# Patient Record
Sex: Male | Born: 1935
Health system: Southern US, Community
[De-identification: ages and names within clinical notes are randomized; demographics above are authoritative.]

## PROBLEM LIST (undated history)

## (undated) DIAGNOSIS — N189 Chronic kidney disease, unspecified: Secondary | ICD-10-CM

## (undated) DIAGNOSIS — K219 Gastro-esophageal reflux disease without esophagitis: Secondary | ICD-10-CM

## (undated) DIAGNOSIS — R0600 Dyspnea, unspecified: Secondary | ICD-10-CM

## (undated) DIAGNOSIS — D649 Anemia, unspecified: Secondary | ICD-10-CM

## (undated) DIAGNOSIS — E782 Mixed hyperlipidemia: Secondary | ICD-10-CM

## (undated) DIAGNOSIS — I739 Peripheral vascular disease, unspecified: Secondary | ICD-10-CM

## (undated) DIAGNOSIS — J449 Chronic obstructive pulmonary disease, unspecified: Secondary | ICD-10-CM

## (undated) DIAGNOSIS — I10 Essential (primary) hypertension: Secondary | ICD-10-CM

## (undated) DIAGNOSIS — G709 Myoneural disorder, unspecified: Secondary | ICD-10-CM

## (undated) HISTORY — DX: Anemia, unspecified: D64.9

## (undated) HISTORY — DX: Mixed hyperlipidemia: E78.2

## (undated) HISTORY — DX: Chronic obstructive pulmonary disease, unspecified: J44.9

## (undated) HISTORY — PX: EYE SURGERY: SHX253

## (undated) HISTORY — DX: Peripheral vascular disease, unspecified: I73.9

## (undated) HISTORY — DX: Gastro-esophageal reflux disease without esophagitis: K21.9

## (undated) HISTORY — PX: TESTICLE REMOVAL: SHX68

## (undated) HISTORY — DX: Essential (primary) hypertension: I10

---

## 1993-11-23 HISTORY — PX: PROSTATECTOMY: SHX69

## 2005-11-23 HISTORY — PX: BLADDER TUMOR EXCISION: SHX238

## 2008-03-30 ENCOUNTER — Ambulatory Visit: Payer: Self-pay | Admitting: Urology

## 2008-04-02 ENCOUNTER — Ambulatory Visit: Payer: Self-pay | Admitting: Urology

## 2008-10-01 ENCOUNTER — Ambulatory Visit: Payer: Self-pay | Admitting: Cardiovascular Disease

## 2009-04-18 DIAGNOSIS — J4489 Other specified chronic obstructive pulmonary disease: Secondary | ICD-10-CM | POA: Insufficient documentation

## 2009-04-18 DIAGNOSIS — D649 Anemia, unspecified: Secondary | ICD-10-CM | POA: Insufficient documentation

## 2009-04-18 DIAGNOSIS — E119 Type 2 diabetes mellitus without complications: Secondary | ICD-10-CM | POA: Insufficient documentation

## 2009-04-18 DIAGNOSIS — I129 Hypertensive chronic kidney disease with stage 1 through stage 4 chronic kidney disease, or unspecified chronic kidney disease: Secondary | ICD-10-CM

## 2009-04-18 DIAGNOSIS — E785 Hyperlipidemia, unspecified: Secondary | ICD-10-CM

## 2009-04-18 DIAGNOSIS — K219 Gastro-esophageal reflux disease without esophagitis: Secondary | ICD-10-CM

## 2009-04-18 DIAGNOSIS — I739 Peripheral vascular disease, unspecified: Secondary | ICD-10-CM | POA: Insufficient documentation

## 2009-04-18 DIAGNOSIS — J449 Chronic obstructive pulmonary disease, unspecified: Secondary | ICD-10-CM

## 2009-04-18 DIAGNOSIS — N183 Chronic kidney disease, stage 3 (moderate): Secondary | ICD-10-CM

## 2009-04-29 ENCOUNTER — Ambulatory Visit: Payer: Self-pay | Admitting: Cardiovascular Disease

## 2009-04-29 ENCOUNTER — Encounter: Payer: Self-pay | Admitting: Cardiovascular Disease

## 2009-04-30 ENCOUNTER — Ambulatory Visit: Payer: Self-pay | Admitting: Urology

## 2009-05-06 ENCOUNTER — Telehealth: Payer: Self-pay | Admitting: Cardiovascular Disease

## 2009-05-07 ENCOUNTER — Ambulatory Visit: Payer: Self-pay | Admitting: Urology

## 2009-06-04 ENCOUNTER — Ambulatory Visit: Payer: Self-pay

## 2009-06-04 ENCOUNTER — Encounter: Payer: Self-pay | Admitting: Cardiovascular Disease

## 2009-11-05 ENCOUNTER — Ambulatory Visit: Payer: Self-pay | Admitting: Cardiovascular Disease

## 2009-11-05 DIAGNOSIS — F17200 Nicotine dependence, unspecified, uncomplicated: Secondary | ICD-10-CM

## 2010-01-15 ENCOUNTER — Encounter: Payer: Self-pay | Admitting: Cardiovascular Disease

## 2010-01-17 ENCOUNTER — Ambulatory Visit: Payer: Self-pay | Admitting: Cardiovascular Disease

## 2010-02-04 ENCOUNTER — Ambulatory Visit: Payer: Self-pay | Admitting: Urology

## 2010-03-05 ENCOUNTER — Emergency Department: Payer: Self-pay | Admitting: Emergency Medicine

## 2010-03-13 ENCOUNTER — Emergency Department: Payer: Self-pay | Admitting: Emergency Medicine

## 2010-06-17 ENCOUNTER — Ambulatory Visit: Payer: Self-pay | Admitting: Urology

## 2010-07-01 ENCOUNTER — Ambulatory Visit: Payer: Self-pay | Admitting: Urology

## 2010-07-02 LAB — PATHOLOGY REPORT

## 2010-09-04 ENCOUNTER — Emergency Department: Payer: Self-pay | Admitting: Emergency Medicine

## 2010-12-23 NOTE — Assessment & Plan Note (Signed)
Summary: SURGICAL CLEARANCE FOR SURGERY   Visit Type:  New patient Referring Provider:  Priscella Donna/McAlhany Primary Provider:  Reginia Forts, MD  CC:  bladder biopsy.  History of Present Illness: Mr. Reineck is a pleasant 76 year old Caucasian male with past medical history significant for hypertension, tobacco abuse, diabetes mellitus, COPD, and hyperlipidemia who was initially seen in our [facility] in November 2009. The patient moved to [place] two  years ago from [place].   Ultrasounds of his legs done in [place] in 2006 showed significant peripheral vascular disease. Interestingly, repeat ultrasounds done at 12 hour on July 2010 showed triphasic waveforms in normal ABIs consistent with no significant lower extremity arterial disease. He also had a carotid ultrasound that showed minimal disease on the left internal carotid artery.  Mr. Leve has been feeling well, walking at the mall. He does have shortness of breath which he attributes to his COPD. He takes nebulizer treatments at home though does not have any inhalers to take. He denies any significant chest pain. Overall he does sit and watch TV a significant amount though it is active with his wife and goes shopping several times per week.  he states that his bladder cancer and his prostate cancer might be back and he is to see Dr. Jacqlyn Larsen next month for a surgical procedure and biopsies. He continues to smoke one half pack per day. He states that his diabetes is well controlled.  Current Problems (verified): 1)  Tobacco Abuse  (ICD-305.1) 2)  Pvd  (ICD-443.9) 3)  Hypertension, Unspecified  (ICD-401.9) 4)  Hyperlipidemia-mixed  (ICD-272.4) 5)  COPD  (ICD-496) 6)  Anemia  (ICD-285.9) 7)  Gerd  (ICD-530.81) 8)  Diabetes Mellitus  (ICD-250.00)  Current Medications (verified): 1)  Aspirin 81 Mg Tbec (Aspirin) .... Take One Tablet By Mouth Daily 2)  Omeprazole 20 Mg Cpdr (Omeprazole) .... Take 1 By Mouth Once Daily 3)   Hydrochlorothiazide 25 Mg Tabs (Hydrochlorothiazide) .... Take 1 By Mouth Once Daily 4)  Simvastatin 40 Mg Tabs (Simvastatin) .... Take One Tablet By Mouth Daily At Bedtime 5)  Metformin Hcl 500 Mg Tabs (Metformin Hcl) .... Take 1 By Mouth in The Am, Take 2 By Mouth in The Pm 6)  Novolin 70/30 70-30 % Susp (Insulin Isophane & Regular) .... As Directed 7)  Iron 65 Mg .... Twice A Day 8)  Vit B6  100 Mg .... Take Once Daily 9)  Fish Oil  Oil (Fish Oil) .... Once Daily 10)  Niacin 500 Mg Tabs (Niacin) .... Once Daily 11)  Lisinopril 10 Mg Tabs (Lisinopril) .... Take One Tablet By Mouth Daily 12)  Loratadine Allergy Relief 10 Mg Tbdp (Loratadine) .... Take Once Daily 13)  Cilostazol 100 Mg Tabs (Cilostazol) .... 2 Tablets Daily 14)  Docusate Sodium 100 Mg Caps (Docusate Sodium) .... 3 Times Daily As Needed 15)  Ipratropium-Albuterol 0.5-2.5 (3) Mg/76ml Soln (Ipratropium-Albuterol) .... Three Times A Day  Allergies (verified): 1)  ! Betadine  Past History:  Past Medical History: Last updated: 04/18/2009 PVD (ICD-443.9) HYPERTENSION, UNSPECIFIED (ICD-401.9) HYPERLIPIDEMIA-MIXED (ICD-272.4) COPD (ICD-496) ANEMIA (ICD-285.9) GERD (ICD-530.81) DIABETES MELLITUS (ICD-250.00)    Past Surgical History: Last updated: 04/18/2009 Prostatectomy - 1995 tumor removed from bladder - 2007  Family History: Last updated: 04/18/2009 Family History of Diabetes:  CHF  Social History: Last updated: 04/18/2009 Retired  Married  Tobacco Use - Yes.  Alcohol Use - no Regular Exercise - yes Drug Use - no  Risk Factors: Exercise: yes (04/18/2009)  Risk Factors:  Smoking Status: current (04/18/2009)  Review of Systems  The patient denies anorexia, fever, weight loss, weight gain, vision loss, decreased hearing, hoarseness, chest pain, syncope, dyspnea on exertion, peripheral edema, prolonged cough, headaches, hemoptysis, abdominal pain, melena, hematochezia, severe indigestion/heartburn,  hematuria, incontinence, genital sores, muscle weakness, suspicious skin lesions, transient blindness, difficulty walking, depression, unusual weight change, abnormal bleeding, enlarged lymph nodes, and testicular masses.    Vital Signs:  Patient profile:   76 year old male Height:      68 inches Weight:      188 pounds BMI:     28.69 Pulse rate:   101 / minute BP sitting:   136 / 64  (left arm) Cuff size:   regular  Vitals Entered By: Mignon Pine, RMA (January 17, 2010 9:36 AM)  Physical Exam  General:  elderly gentleman in no apparent distress, alert and oriented x3, HEENT exam is benign, neck is supple with no JVP or carotid bruits, heart sounds are regular with normal S1 and S2 and no murmurs appreciated, lungs are clear though moderately decreased breath sounds bilaterally throughout, abdominal exam notable for moderate obesity the otherwise benign, no significant lower extremity edema, neurologic exam is nonfocal skin is warm and dry.    EKG  Procedure date:  01/17/2010  Findings:      sinus tachycardia with rate of 101 beats per minute, left axis deviation, no significant ST or T wave changes.  Impression & Recommendations:  Problem # 1:  PRE-OPERATIVE CARDIAC EXAM (ICD-V72.81) No known coronary artery disease. There is a significant discrepancy between a lower extremity ultrasound in 2006 done in [place] and a more recent ultrasound done at [place], with  his carotids and lower extremities. The more recent studies suggest no significant peripheral vascular disease. He has no angina or suggestion of underlying active cardiac pathology, no claudication symptoms. His heart rate is elevated which I believe is due to his severe COPD. He states that he would like an inhaler for the daytime when  he is out of the house and I have prescribed a Combivent inhaler. Have asked him to try the Combivent inhaler though monitor his heart rate closely. If he has worsening tachycardia with  albuterol, we may need to change this to Xopenex inhaler. He is uncertain if he would be able to afford other inhalers. He'll monitor his baseline heart rate for me at home through the course of the next month in preparation for surgery.  Overall he would be low risk for his upcoming surgery next month. No further cardiac testing is indicated.  If his heart rate is elevated heading into surgery, he may benefit from a low-dose beta blocker such as by bystolic 5 milligrams the day prior and the day of surgery. We have samples that he could have for these few days.   His updated medication list for this problem includes:    Aspirin 81 Mg Tbec (Aspirin) .Marland Kitchen... Take one tablet by mouth daily    Lisinopril 10 Mg Tabs (Lisinopril) .Marland Kitchen... Take one tablet by mouth daily    Cilostazol 100 Mg Tabs (Cilostazol) .Marland Kitchen... 2 tablets daily  Problem # 2:  HYPERTENSION, UNSPECIFIED (ICD-401.9) Well-controlled on his current medications. No medication changes made at this time. His updated medication list for this problem includes:    Aspirin 81 Mg Tbec (Aspirin) .Marland Kitchen... Take one tablet by mouth daily    Hydrochlorothiazide 25 Mg Tabs (Hydrochlorothiazide) .Marland Kitchen... Take 1 by mouth once daily    Lisinopril 10  Mg Tabs (Lisinopril) .Marland Kitchen... Take one tablet by mouth daily  Problem # 3:  PVD (ICD-443.9) mild carotid arterial disease by the most recent ultrasounds done through our clinic. It is a clear discrepancy with the ultrasounds done in [place] 5 years ago. We'll continue aggressive lipid management and aspirin.  Patient Instructions: 1)  Your physician recommends that you schedule a follow-up appointment in: 1 year 2)  Your physician has recommended you make the following change in your medication: Combivent Inhaler called into [facility]  Appended Document: SURGICAL CLEARANCE FOR SURGERY    Clinical Lists Changes  Medications: Changed medication from COMBIVENT 18-103 MCG/ACT AERO (IPRATROPIUM-ALBUTEROL) 1 puff every  4 hours as needed to PROAIR HFA 108 (90 BASE) MCG/ACT AERS (ALBUTEROL SULFATE) 1 puff every 4 hours as needed

## 2010-12-23 NOTE — Progress Notes (Signed)
Summary: Imprimis  Imprimis   Imported By: Zenovia Jarred 01/20/2010 10:29:58  _____________________________________________________________________  External Attachment:    Type:   Image     Comment:   External Document

## 2011-01-15 ENCOUNTER — Encounter: Payer: Self-pay | Admitting: Cardiovascular Disease

## 2011-02-04 ENCOUNTER — Ambulatory Visit: Payer: Self-pay | Admitting: Family Medicine

## 2011-02-04 LAB — HM DEXA SCAN

## 2011-02-09 ENCOUNTER — Telehealth: Payer: Self-pay | Admitting: Cardiovascular Disease

## 2011-02-10 ENCOUNTER — Telehealth: Payer: Self-pay | Admitting: Cardiovascular Disease

## 2011-02-10 NOTE — Telephone Encounter (Signed)
Phone Note  Call from Patient Call back at Home Phone [phone]   Caller: Wife Arrie Aran) Call For: Gollan Summary of Call: BP Readings: 3/11 - 123/64, 3/13 - 129/56, 3/15 -100/51, 3/17 - 95/42, 3/19 - 80/43 Initial call taken by: Zenovia Jarred,  February 09, 2011 11:42 AM

## 2011-02-10 NOTE — Telephone Encounter (Signed)
Spoke to pt's wife, she states with pt's BP 80/43 now he is asymptomatic. Instructed that pt to incr fluids right now and to rest. Advised he monitor BP, and to call office if BP decreases. Pt was started on Amlodipine at last ov after HCTZ was stopped. Pt is also taking Lisinopril. Advised pt hold both Lisinopril and Amlodipine until BP back to baseline and when this occurs to only start Lisinopril again per Dr. Rockey Situ. Pt will write down BP numbers for ~5 days and send to office or call in so Dr. Rockey Situ can assess if changes need to be made. Pt ok with this. I called back later today and pt's wife states his BP was 104/49.

## 2011-02-16 ENCOUNTER — Telehealth: Payer: Self-pay | Admitting: Cardiovascular Disease

## 2011-02-16 NOTE — Telephone Encounter (Signed)
Notified patient needs to bring in BP & Heart rate readings for the doctor to review.  The patient states readings are looking really good and the patient will bring in on Friday for the doctor to review.

## 2011-02-16 NOTE — Telephone Encounter (Signed)
Pt has BP Readings from this past Thursday through today to give.

## 2011-02-24 ENCOUNTER — Telehealth: Payer: Self-pay | Admitting: *Deleted

## 2011-02-24 NOTE — Progress Notes (Signed)
Summary: At Home BP Readings  Phone Note Call from Patient Call back at Home Phone [phone]   Caller: Wife Arrie Aran) Call For: Collie Kittel Summary of Call: BP Readings: 3/11 - 123/64, 3/13 - 129/56, 3/15 -100/51, 3/17 - 95/42, 3/19 - 80/43 Initial call taken by: Zenovia Jarred,  February 09, 2011 11:42 AM  Follow-up for Phone Call        Copied and followed up in [place] RN  February 16, 2011 5:05 PM

## 2011-02-24 NOTE — Telephone Encounter (Signed)
Called and spoke to pt's wife, notified her that pt's BP results he dropped off look great per Dr. Rockey Situ.

## 2011-03-03 ENCOUNTER — Ambulatory Visit: Payer: Self-pay | Admitting: Family Medicine

## 2011-04-07 NOTE — Assessment & Plan Note (Signed)
[facility] OFFICE NOTE   KIRKLAND, GOTTESMAN                       MRN:          [id number]  DATE:10/01/2008                            DOB:          Dec 30, 1934    PRIMARY CARE PHYSICIAN:  Reginia Forts, MD   HISTORY OF PRESENT ILLNESS:  Mr. Foth is a pleasant 76 year old  Caucasian male with past medical history significant for hypertension,  known peripheral vascular disease, tobacco abuse, diabetes mellitus, and  hyperlipidemia who presents today to establish care in our Peripheral  Vascular Clinic.  The patient has been followed by Dr. Rockey Situ of  [facility]; however, his insurance is no longer paying for  him to be seen in this office and he wishes to establish care with  [facility].  The patient moved to [place]  little over a year ago from [place].  He tells me that when he was in  [place], he was diagnosed with lower extremity peripheral vascular  disease.  Records obtained from the [facility] from Apr 22, 2005, showed that the patient had arterial Doppler studies of the lower  extremities performed that showed moderate-to-severe peripheral vascular  occlusive disease at rest in the right leg.  The right ankle brachial  index was 0.57 and with exercise decreased to 0.38.  There was mild  disease noted in the left leg.  The resting ankle brachial index was  0.83 and with exercise increased is 0.87.  The studies were suggestive  of disease in the right iliofemoral artery, right femoral popliteal  artery, and the right tibioperoneal segments.  The patient was started  on Pletal four years ago and started a strict walking program.  He tells  me that since he has been doing his walking on a daily basis, he has no  pain in his lower extremities.  He tells me that he can walk over a mile  each day without having any pain in the right or left lower  extremity.  He has no other complaints today and denies any chest pain, shortness of  breath, palpitations, dizziness, nausea, vomiting, near-syncope,  syncope, orthopnea, PND, or lower extremity edema.  He and his wife both  continue to smoke one-half pack of cigarettes per day and the patient  states that he has no desire to stop.  He has been followed by Dr. Tamala Julian  for his primary care needs and tells me that he has been taking all of  his medications as prescribed.   PAST MEDICAL HISTORY:  1. Hyperlipidemia.  2. Anemia.  3. Tobacco abuse.  4. Hypertension.  5. Peripheral vascular disease.  6. Allergic rhinitis.  7. COPD.  8. Gastroesophageal reflux disease.  9. Prostate cancer.  10.Bladder cancer.  11.Diabetes mellitus.   PAST SURGICAL HISTORY:  1. Prostatectomy.  2. Bladder surgery.   ALLERGIES:  BETADINE.   CURRENT MEDICATIONS:  1. Omeprazole 40 mg once daily.  2. Vitamin B6 100 mg once daily.  3. Cilostazol 100 mg twice daily.  4. Aspirin 81 mg once daily.  5.  Hydrochlorothiazide 25 mg once daily.  6. Zocor 40 mg once daily.  7. Metformin 500 mg twice daily.  8. Lisinopril 10 mg once daily.  9. Vitamin E 400 units twice daily.  10.Fish oil 1000 mg three times daily.  11.70/30 insulin 7 units in the morning and 6 units in the p.m.  12.Stool softener two times per week.  13.Niacin 1000 mg once daily.  14.Iron 65 mg once daily.  15.Albuterol nebulizers twice daily.  16.Ipratropium bromide 0.5 mg twice daily via nebulizer.   SOCIAL HISTORY:  The patient smoked three packs of cigarettes per day  for 55 years and has been smoking one-half pack of cigarettes per day  for the last 4 years.  He denies use of alcohol or illicit drugs.  He is  married and has four children.   FAMILY HISTORY:  The patient's mother died from a perforated ulcer and  his father died from natural causes at an old age.  His brother died  from complications of coronary artery disease as well  as many other  chronic medical conditions.   REVIEW OF SYSTEMS:  As stated in the history of present illness and is  otherwise negative.   PHYSICAL EXAMINATION:  VITALS:  Blood pressure 128/62, pulse 88 and  regular, respirations 12 and nonlabored.  GENERAL:  He is a thin elderly Caucasian male in no acute distress.  He  is alert and oriented x3.  PSYCHIATRIC:  Mood and affect are appropriate.  MUSCULOSKELETAL:  Muscle strength and tone are normal.  NEUROLOGICAL:  No focal neurological deficits.  SKIN:  Warm and dry.  HEENT:  Normal.  NECK:  No JVD.  No carotid bruits.  No thyromegaly.  No lymphadenopathy.  LUNGS:  Clear to auscultation bilaterally with decreased air movement  bilaterally.  There are no wheezes noted.  CARDIOVASCULAR:  Regular rate  and rhythm without murmurs, gallops, or rubs noted.  ABDOMEN:  Soft, nontender, nondistended.  Bowel sounds are present.  EXTREMITIES:  No evidence of edema.  Pulses are 1+ in the bilateral  dorsalis pedis and bilateral posterior tibial arteries.  Pulses are 2+  in the bilateral radial arteries.  The bilateral feet demonstrate no  ulcerations and no color changes.  The feet are pink and warm  bilaterally.   DIAGNOSTIC STUDIES:  1. Peripheral vascular studies performed in May 2006 in [place] as      described in detail above.  2. A 12-lead EKG from July 2009 shows normal sinus rhythm with left      axis deviation and no other abnormalities.   ASSESSMENT AND PLAN:  This is a pleasant 76 year old Caucasian male with  multiple medical problems including hypertension, hyperlipidemia,  tobacco abuse, diabetes mellitus, COPD, and peripheral vascular disease  who presents today to establish care in our Peripheral Vascular Clinic.  The patient has had documentation of severe right lower extremity  peripheral vascular disease by noninvasive studies performed 4 years  ago.  He was started on Pletal at that time and a strict walking  program.   He describes to me no complaints that are suggestive of  claudication.  He walks one mile daily and has no pain in his lower  extremities.  I think at this point aggressive risk factor modification  is a most important step for this patient.  I have encouraged him to  continue taking his current medications and to continue his walking  program.  I also counseled him to stop smoking if  that is at all  possible.  The patient is not agreeable to stopping smoking at the  current time.  I would like to see him back in this office in 6 months.  I think at that time we should check carotid Doppler studies to rule out  occlusive carotid artery disease.  We may also repeat noninvasive  Doppler studies of the lower extremities down the road if the patient  has any symptoms.     Lauree Chandler, MD  Electronically Signed    CM/MedQ  DD: 10/01/2008  DT: 10/02/2008  Job #: [id number]   cc:   Reginia Forts, M.D.

## 2011-05-08 ENCOUNTER — Encounter: Payer: Self-pay | Admitting: Cardiovascular Disease

## 2011-05-29 ENCOUNTER — Ambulatory Visit (INDEPENDENT_AMBULATORY_CARE_PROVIDER_SITE_OTHER): Payer: 59 | Admitting: Cardiovascular Disease

## 2011-05-29 ENCOUNTER — Encounter: Payer: Self-pay | Admitting: Cardiovascular Disease

## 2011-05-29 DIAGNOSIS — I739 Peripheral vascular disease, unspecified: Secondary | ICD-10-CM

## 2011-05-29 DIAGNOSIS — F17200 Nicotine dependence, unspecified, uncomplicated: Secondary | ICD-10-CM

## 2011-05-29 DIAGNOSIS — E785 Hyperlipidemia, unspecified: Secondary | ICD-10-CM

## 2011-05-29 DIAGNOSIS — I10 Essential (primary) hypertension: Secondary | ICD-10-CM

## 2011-05-29 DIAGNOSIS — E119 Type 2 diabetes mellitus without complications: Secondary | ICD-10-CM

## 2011-05-29 DIAGNOSIS — J449 Chronic obstructive pulmonary disease, unspecified: Secondary | ICD-10-CM

## 2011-05-29 MED ORDER — ALBUTEROL 90 MCG/ACT IN AERS: 2.0000 | INHALATION_SPRAY | Freq: Four times a day (QID) | RESPIRATORY_TRACT | Status: DC | PRN

## 2011-05-29 NOTE — Patient Instructions (Addendum)
You are doing well. No medication changes were made. Please call us if you have new issues that need to be addressed before your next appt.  We will call you for a follow up Appt. In 12 months  

## 2011-05-30 NOTE — Assessment & Plan Note (Signed)
.   No changes to the medications were made.Continue simvastatin.

## 2011-05-30 NOTE — Assessment & Plan Note (Signed)
We have encouraged continued exercise, careful diet management in an effort to lose weight. 

## 2011-05-30 NOTE — Progress Notes (Signed)
Patient ID: Erik Gibbs, male    DOB: 25-Jan-1935, 76 y.o.   MRN: [id number]  HPI Comments: Erik Gibbs is a pleasant 76 year old Caucasian male with past medical history significant for hypertension, tobacco abuse, diabetes mellitus, COPD, and hyperlipidemia who was initially seen in our [facility] clinic in November 2009. The patient moved to [place] from [place] several years ago.  Erik Gibbs reports it has been a challenging year. Erik Gibbs has been treated for bladder cancer with BCG treatments.  Erik Gibbs continues to smoke one half pack per day. His wife also smokes. Erik Gibbs has been trying to work on his diabetes.  In the past, Erik Gibbs has used an albuterol inhaler. Erik Gibbs is almost out of his inhaler.    Erik Gibbs walks at the mall. Erik Gibbs does have shortness of breath which Erik Gibbs attributes to his COPD. Erik Gibbs takes nebulizer treatments at home.Marland Kitchen Erik Gibbs denies any significant chest pain. Overall Erik Gibbs does sit and watch TV a significant amount though it is active with his wife and goes shopping several times per week.   Ultrasounds of his legs  July 2010 showed triphasic waveforms in normal ABIs consistent with no significant lower extremity arterial disease.  carotid ultrasound  showed minimal disease on the left internal carotid artery.  EKG shows normal sinus rhythm with rate 80 beats per minute with no significant ST or T wave changes   Outpatient Encounter Prescriptions as of 05/29/2011  Medication Sig Dispense Refill  . aspirin 81 MG tablet Take 81 mg by mouth daily.        . cilostazol (PLETAL) 100 MG tablet Take 100 mg by mouth 2 (two) times daily.        Marland Kitchen docusate sodium (COLACE) 100 MG capsule Take 100 mg by mouth 3 (three) times daily as needed.        . Ferrous Sulfate (IRON) 325 (65 FE) MG TABS Take by mouth 2 (two) times daily.        . insulin NPH-insulin regular (NOVOLIN 70/30) (70-30) 100 UNIT/ML injection Inject into the skin as directed.        Marland Kitchen ipratropium-albuterol (DUONEB) 0.5-2.5 (3) MG/3ML SOLN Take 3 mLs by  nebulization 3 (three) times daily.        Marland Kitchen lisinopril (PRINIVIL,ZESTRIL) 10 MG tablet Take 10 mg by mouth daily.        Marland Kitchen loratadine (CLARITIN) 10 MG tablet Take 10 mg by mouth daily.        . niacin 500 MG tablet Take 500 mg by mouth daily.        . Omega-3 Fatty Acids (FISH OIL) 1200 MG CAPS Take by mouth daily.        Marland Kitchen omeprazole (PRILOSEC) 20 MG capsule Take 20 mg by mouth daily.        Marland Kitchen pyridOXINE (VITAMIN B-6) 100 MG tablet Take 100 mg by mouth daily.        . simvastatin (ZOCOR) 40 MG tablet Take 40 mg by mouth at bedtime.        Marland Kitchen albuterol (PROVENTIL,VENTOLIN) 90 MCG/ACT inhaler Inhale 2 puffs into the lungs every 6 (six) hours as needed for wheezing.  17 g  6     Review of Systems  Constitutional: Negative.   HENT: Negative.   Eyes: Negative.   Respiratory: Positive for shortness of breath.   Cardiovascular: Negative.   Gastrointestinal: Negative.   Musculoskeletal: Positive for arthralgias.  Skin: Negative.   Neurological: Negative.   Hematological: Negative.   Psychiatric/Behavioral: Negative.  All other systems reviewed and are negative.    BP 137/71  Pulse 80  Ht 5\' 8"  (1.727 m)  Wt 178 lb (80.74 kg)  BMI 27.06 kg/m2   Physical Exam  Nursing note and vitals reviewed. Constitutional: Erik Gibbs is oriented to person, place, and time. Erik Gibbs appears well-developed and well-nourished.  HENT:  Head: Normocephalic.  Nose: Nose normal.  Mouth/Throat: Oropharynx is clear and moist.  Eyes: Conjunctivae are normal. Pupils are equal, round, and reactive to light.  Neck: Normal range of motion. Neck supple. No JVD present.  Cardiovascular: Normal rate, regular rhythm, S1 normal, S2 normal, normal heart sounds and intact distal pulses.  Exam reveals no gallop and no friction rub.   No murmur heard. Pulmonary/Chest: Effort normal and breath sounds normal. No respiratory distress. Erik Gibbs has no wheezes. Erik Gibbs has no rales. Erik Gibbs exhibits no tenderness.  Abdominal: Soft. Bowel sounds are  normal. Erik Gibbs exhibits no distension. There is no tenderness.  Musculoskeletal: Normal range of motion. Erik Gibbs exhibits no edema and no tenderness.  Lymphadenopathy:    Erik Gibbs has no cervical adenopathy.  Neurological: Erik Gibbs is alert and oriented to person, place, and time. Coordination normal.  Skin: Skin is warm and dry. No rash noted. No erythema.  Psychiatric: Erik Gibbs has a normal mood and affect. His behavior is normal. Judgment and thought content normal.           Assessment and Plan

## 2011-05-30 NOTE — Assessment & Plan Note (Signed)
We have encouraged him and his wife to consider smoking cessation techniques. They seem reluctant to try anything. His wife seems to enjoy it.

## 2011-05-30 NOTE — Assessment & Plan Note (Signed)
He commented that the price of albuterol was too expensive. We will try an alternate generic inhaler. I've asked him to follow up with Dr. Tamala Julian.

## 2011-05-30 NOTE — Assessment & Plan Note (Signed)
Blood pressure is well controlled on today's visit. No changes made to the medications. 

## 2011-08-05 ENCOUNTER — Ambulatory Visit: Payer: Self-pay | Admitting: Family Medicine

## 2011-09-23 ENCOUNTER — Ambulatory Visit: Payer: Self-pay | Admitting: Family Medicine

## 2011-12-17 ENCOUNTER — Ambulatory Visit: Payer: Self-pay | Admitting: Family Medicine

## 2012-12-09 ENCOUNTER — Ambulatory Visit: Payer: Self-pay | Admitting: Family Medicine

## 2012-12-13 DIAGNOSIS — D090 Carcinoma in situ of bladder: Secondary | ICD-10-CM | POA: Insufficient documentation

## 2012-12-13 DIAGNOSIS — C679 Malignant neoplasm of bladder, unspecified: Secondary | ICD-10-CM | POA: Insufficient documentation

## 2014-07-09 ENCOUNTER — Ambulatory Visit: Payer: Self-pay | Admitting: Family Medicine

## 2014-08-08 ENCOUNTER — Encounter: Payer: Self-pay | Admitting: Pulmonary Disease

## 2014-08-08 ENCOUNTER — Ambulatory Visit (INDEPENDENT_AMBULATORY_CARE_PROVIDER_SITE_OTHER): Payer: Commercial Managed Care - HMO | Admitting: Pulmonary Disease

## 2014-08-08 VITALS — BP 134/66 | HR 78 | Ht 68.0 in | Wt 208.0 lb

## 2014-08-08 DIAGNOSIS — Z23 Encounter for immunization: Secondary | ICD-10-CM

## 2014-08-08 DIAGNOSIS — J449 Chronic obstructive pulmonary disease, unspecified: Secondary | ICD-10-CM

## 2014-08-08 DIAGNOSIS — J438 Other emphysema: Secondary | ICD-10-CM

## 2014-08-08 DIAGNOSIS — E119 Type 2 diabetes mellitus without complications: Secondary | ICD-10-CM

## 2014-08-08 LAB — CBC WITH DIFFERENTIAL/PLATELET
Basophils Absolute: 0 10*3/uL (ref 0.0–0.1)
Basophils Relative: 0.6 % (ref 0.0–3.0)
EOS ABS: 0.3 10*3/uL (ref 0.0–0.7)
EOS PCT: 5.5 % — AB (ref 0.0–5.0)
HEMATOCRIT: 31.4 % — AB (ref 39.0–52.0)
Hemoglobin: 10.6 g/dL — ABNORMAL LOW (ref 13.0–17.0)
LYMPHS ABS: 1.5 10*3/uL (ref 0.7–4.0)
Lymphocytes Relative: 25.6 % (ref 12.0–46.0)
MCHC: 33.7 g/dL (ref 30.0–36.0)
MCV: 95.8 fl (ref 78.0–100.0)
MONO ABS: 0.5 10*3/uL (ref 0.1–1.0)
Monocytes Relative: 8.3 % (ref 3.0–12.0)
Neutro Abs: 3.5 10*3/uL (ref 1.4–7.7)
Neutrophils Relative %: 60 % (ref 43.0–77.0)
PLATELETS: 192 10*3/uL (ref 150.0–400.0)
RBC: 3.28 Mil/uL — ABNORMAL LOW (ref 4.22–5.81)
RDW: 14.3 % (ref 11.5–15.5)
WBC: 5.8 10*3/uL (ref 4.0–10.5)

## 2014-08-08 NOTE — Patient Instructions (Signed)
Stop taking symbicort Start taking Anoro one puff a day no matter how you feel Keep using your duoneb as you are doing  Ask Dr. Venia Minks or your insurance company about the cost of prevnar-13 so you can have it  Bring [place] a sample of your mucus tomorrow morning  Exercise regularly as much as possible  We will see you back in 4 weeks or sooner if needed

## 2014-08-08 NOTE — Assessment & Plan Note (Signed)
He has had a lot of difficulty with hyperglycemia lately. Most of this was related to prednisone but the problem does persist. I am going to try to stop the Symbicort as sometimes that can cause elevated blood sugars. I'm going to replace it with a combination of long-acting bronchodilators (Anoro).

## 2014-08-08 NOTE — Progress Notes (Signed)
Subjective:    Patient ID: Erik Gibbs, male    DOB: 07-May-1935, 79 y.o.   MRN: [id number]  HPI  Erik Gibbs has been sent her by hish PCP for recurrent COPD exacerbations.  He has been on repeated courses of prednisone and zpacks over the years and this "reeks havoc" on his blood sugar.  Up until this last year he only had one exacerbation per year. But in the last 6 months he has been in 3 times for prednisont and aizthromycine.  He still has green mucus in the mornings.      He has had sinus drainage for years which has been about the same in the last few months.    He has a constant cough with mucus production for a while.  He has been on duoneb for about ten years and symbicort this year.  Childhood was normal without respiratory problems.  He had pneumonia once as an adult, but not as a child.  He smoked about 3 packs per day at age 38 and quit in 2011 at age 70.  He has since gained about 50 pounds since then.   He has never been hospitalized for COPD.  He had spirometyr about a year ago.    Between episodes of COPD exacerbations he has some dyspne aon exertion.  He can't vacuum without going slow and he has to walk slower than others.  He can walk a mile, but he has to stop and take a break.  He can walk around [employer] without too much difficulty.  Can't walk up stairs due to dyspnea.    Past Medical History  Diagnosis Date  . PVD (peripheral vascular disease)   . Hypertension   . Hyperlipidemia, mixed   . COPD (chronic obstructive pulmonary disease)   . Anemia   . GERD (gastroesophageal reflux disease)   . Diabetes mellitus      Family History  Problem Relation Age of Onset  . Diabetes Other   . Heart failure Other   . COPD Brother   . Emphysema Father   . Asthma Son     childhood  . Cancer Father     prostate     History   Social History  . Marital Status: Married    Spouse Name: N/A    Number of Children: N/A  . Years of Education: N/A   Occupational  History  . Retired    Social History Main Topics  . Smoking status: Former Smoker -- 3.00 packs/day for 62 years    Types: Cigarettes    Quit date: 08/08/2010  . Smokeless tobacco: Never Used  . Alcohol Use: No  . Drug Use: No  . Sexual Activity: Not on file   Other Topics Concern  . Not on file   Social History Narrative   Married   Gets regular exercise     Allergies  Allergen Reactions  . Amlodipine     BP drops severly  . Nitrofuran Derivatives   . Povidone-Iodine      Outpatient Prescriptions Prior to Visit  Medication Sig Dispense Refill  . aspirin 81 MG tablet Take 81 mg by mouth daily.        . cilostazol (PLETAL) 100 MG tablet Take 100 mg by mouth 2 (two) times daily.       Marland Kitchen docusate sodium (COLACE) 100 MG capsule Take 100 mg by mouth 3 (three) times daily as needed.        Marland Kitchen  Ferrous Sulfate (IRON) 325 (65 FE) MG TABS Take by mouth 3 (three) times daily.       . insulin NPH-insulin regular (NOVOLIN 70/30) (70-30) 100 UNIT/ML injection Inject into the skin as directed.        Marland Kitchen ipratropium-albuterol (DUONEB) 0.5-2.5 (3) MG/3ML SOLN Take 3 mLs by nebulization 3 (three) times daily.        Marland Kitchen lisinopril (PRINIVIL,ZESTRIL) 10 MG tablet Take 10 mg by mouth daily.        Marland Kitchen loratadine (CLARITIN) 10 MG tablet Take 10 mg by mouth daily.        . niacin 500 MG tablet Take 1,000 mg by mouth daily.       . Omega-3 Fatty Acids (FISH OIL) 1200 MG CAPS Take by mouth daily.        Marland Kitchen omeprazole (PRILOSEC) 20 MG capsule Take 20 mg by mouth daily.        Marland Kitchen pyridOXINE (VITAMIN B-6) 100 MG tablet Take 100 mg by mouth daily.        . simvastatin (ZOCOR) 40 MG tablet Take 20 mg by mouth at bedtime.       Marland Kitchen albuterol (PROVENTIL,VENTOLIN) 90 MCG/ACT inhaler Inhale 2 puffs into the lungs every 6 (six) hours as needed for wheezing.  17 g  6   No facility-administered medications prior to visit.      Review of Systems  Constitutional: Negative for fever and unexpected weight change.    HENT: Positive for congestion, postnasal drip and sinus pressure. Negative for dental problem, ear pain, nosebleeds, rhinorrhea, sneezing, sore throat and trouble swallowing.   Eyes: Negative for redness and itching.  Respiratory: Positive for cough and shortness of breath. Negative for chest tightness and wheezing.   Cardiovascular: Negative for palpitations and leg swelling.  Gastrointestinal: Negative for nausea and vomiting.  Genitourinary: Negative for dysuria.  Musculoskeletal: Negative for joint swelling.  Skin: Negative for rash.  Neurological: Negative for headaches.  Hematological: Does not bruise/bleed easily.  Psychiatric/Behavioral: Negative for dysphoric mood. The patient is not nervous/anxious.        Objective:   Physical Exam Filed Vitals:   08/08/14 1011  BP: 134/66  Pulse: 78  Height: 5\' 8"  (1.727 m)  Weight: 208 lb (94.348 kg)  SpO2: 95%   room air  Ambulated 500 feet on room air and his oxygen saturation remained above 95%  Gen: well appearing, no acute distress HEENT: NCAT, PERRL, EOMi, OP clear, neck supple without masses PULM: Diminished bilaterally, some wheezing CV: RRR, no mgr, no JVD AB: BS+, soft, nontender, no hsm Ext: warm, no edema, no clubbing, no cyanosis Derm: no rash or skin breakdown Neuro: A&Ox4, CN II-XII intact, strength 5/5 in all 4 extremities   08/08/14 Simple Spirometry > ratio 37%, FEV1 1.20 L (39% predicted). April 2015 chest x-ray at [facility]> emphysema noted, mild scarring in right lung base     Assessment & Plan:   COPD COPD: GOLD GRADE D, Stage IV airflow obstruction Combined recommendations from the [place], [employer]icians, Investment banker, corporate, European Respiratory Society (Qaseem A et al, Ann Intern Med. 2011;155(3):179) recommends tobacco cessation, pulmonary rehab (for symptomatic patients with an FEV1 < 50% predicted), supplemental oxygen (for patients with SaO2 <88% or  paO2 <55), and appropriate bronchodilator therapy.  In regards to long acting bronchodilators, they recommend monotherapy (FEV1 60-80% with symptoms weak evidence, FEV1 with symptoms <60% strong evidence), or combination therapy (FEV1 <60% with symptoms, strong  recommendation, moderate evidence).  One should also provide patients with annual immunizations and consider therapy for prevention of COPD exacerbations (ie. roflumilast or azithromycin) when appopriate.  -O2 therapy: Not indicated -Immunizations: Flu shot today, he will check with his primary care physician about Prevnar -Tobacco use: Quit 4 years ago -Exercise: Encouraged regular exercise, advised to pulmonary rehabilitation but he declined -Bronchodilator therapy: Change to Anoro (see below) -Exacerbation prevention: This is his biggest problem. I worry about the possibility of AB PA versus an atypical mycobacterial infection. I am going to check serum eosinophils, serum IgE, as well as sputum culture for bacterial, fungal, and mycobacterial cultures.   DIABETES MELLITUS  He has had a lot of difficulty with hyperglycemia lately. Most of this was related to prednisone but the problem does persist. I am going to try to stop the Symbicort as sometimes that can cause elevated blood sugars. I'm going to replace it with a combination of long-acting bronchodilators (Anoro).   Updated Medication List Outpatient Encounter Prescriptions as of 08/08/2014  Medication Sig  . aspirin 81 MG tablet Take 81 mg by mouth daily.    . budesonide-formoterol (SYMBICORT) 160-4.5 MCG/ACT inhaler Inhale 1 puff into the lungs 2 (two) times daily.  . cilostazol (PLETAL) 100 MG tablet Take 100 mg by mouth 2 (two) times daily.   Marland Kitchen docusate sodium (COLACE) 100 MG capsule Take 100 mg by mouth 3 (three) times daily as needed.    . Ferrous Sulfate (IRON) 325 (65 FE) MG TABS Take by mouth 3 (three) times daily.   Marland Kitchen glipiZIDE (GLUCOTROL) 10 MG tablet Take 10 mg by  mouth daily.  . insulin NPH-insulin regular (NOVOLIN 70/30) (70-30) 100 UNIT/ML injection Inject into the skin as directed.    Marland Kitchen ipratropium-albuterol (DUONEB) 0.5-2.5 (3) MG/3ML SOLN Take 3 mLs by nebulization 3 (three) times daily.    Marland Kitchen lisinopril (PRINIVIL,ZESTRIL) 10 MG tablet Take 10 mg by mouth daily.    Marland Kitchen loratadine (CLARITIN) 10 MG tablet Take 10 mg by mouth daily.    . montelukast (SINGULAIR) 10 MG tablet Take 10 mg by mouth at bedtime.  . niacin 500 MG tablet Take 1,000 mg by mouth daily.   . Omega-3 Fatty Acids (FISH OIL) 1200 MG CAPS Take by mouth daily.    Marland Kitchen omeprazole (PRILOSEC) 20 MG capsule Take 20 mg by mouth daily.    Marland Kitchen pyridOXINE (VITAMIN B-6) 100 MG tablet Take 100 mg by mouth daily.    . simvastatin (ZOCOR) 40 MG tablet Take 20 mg by mouth at bedtime.   Marland Kitchen albuterol (PROVENTIL,VENTOLIN) 90 MCG/ACT inhaler Inhale 2 puffs into the lungs every 6 (six) hours as needed for wheezing.

## 2014-08-08 NOTE — Assessment & Plan Note (Addendum)
COPD: GOLD GRADE D, Stage IV airflow obstruction Combined recommendations from the [place], [employer], Investment banker, corporate, European Respiratory Society (Qaseem A et al, Ann Intern Med. 2011;155(3):179) recommends tobacco cessation, pulmonary rehab (for symptomatic patients with an FEV1 < 50% predicted), supplemental oxygen (for patients with SaO2 <88% or paO2 <55), and appropriate bronchodilator therapy.  In regards to long acting bronchodilators, they recommend monotherapy (FEV1 60-80% with symptoms weak evidence, FEV1 with symptoms <60% strong evidence), or combination therapy (FEV1 <60% with symptoms, strong recommendation, moderate evidence).  One should also provide patients with annual immunizations and consider therapy for prevention of COPD exacerbations (ie. roflumilast or azithromycin) when appopriate.  -O2 therapy: Not indicated -Immunizations: Flu shot today, he will check with his primary care physician about Prevnar -Tobacco use: Quit 4 years ago -Exercise: Encouraged regular exercise, advised to pulmonary rehabilitation but he declined -Bronchodilator therapy: Change to Anoro (see below) -Exacerbation prevention: This is his biggest problem. I worry about the possibility of AB PA versus an atypical mycobacterial infection. I am going to check serum eosinophils, serum IgE, as well as sputum culture for bacterial, fungal, and mycobacterial cultures.

## 2014-08-09 ENCOUNTER — Encounter: Payer: Self-pay | Admitting: Pulmonary Disease

## 2014-08-09 LAB — IGE: IgE (Immunoglobulin E), Serum: 8 kU/L (ref <115)

## 2014-08-09 NOTE — Addendum Note (Signed)
Addended by: Karlene Einstein D on: 08/09/2014 11:13 AM   Modules accepted: Orders

## 2014-08-10 ENCOUNTER — Telehealth: Payer: Self-pay | Admitting: Pulmonary Disease

## 2014-08-10 NOTE — Addendum Note (Signed)
Addended by: Karlene Einstein D on: 08/10/2014 04:47 PM   Modules accepted: Orders

## 2014-08-10 NOTE — Progress Notes (Signed)
Quick Note:  lmtcb X1 to relay results. ______ 

## 2014-08-10 NOTE — Progress Notes (Signed)
Quick Note:  Spoke with pt's wife (dpr on file) to explain results. They will expect a call from BQ to further explain this. ______

## 2014-08-10 NOTE — Telephone Encounter (Signed)
A,  I tried to call him to let him know that his eosinophil count was up and likely represents an allergy of some sort. However I had to leave a message. Please let him know that I'd like to talk to him more about this sometime soon. Hoyle Sauer, consider for [employer] B  ----------------------------------------------  Spoke with pt's wife (dpr on file) to explain results.  States that they are available to speak most afternoons.  They would like to discuss this further with Dr. Lake Bells as he is available.  They realize this may not be today.  Forwarding to Dr. Lake Bells as an Juluis Rainier.

## 2014-08-13 NOTE — Addendum Note (Signed)
Addended by: Karlene Einstein D on: 08/13/2014 11:14 AM   Modules accepted: Orders

## 2014-08-15 LAB — ALPHA-1-ANTITRYPSIN PHENOTYP: A-1 Antitrypsin: 161 mg/dL (ref 90–200)

## 2014-08-16 LAB — LOWER RESPIRATORY CULTURE

## 2014-09-05 ENCOUNTER — Ambulatory Visit (INDEPENDENT_AMBULATORY_CARE_PROVIDER_SITE_OTHER): Payer: Commercial Managed Care - HMO | Admitting: Pulmonary Disease

## 2014-09-05 ENCOUNTER — Encounter: Payer: Self-pay | Admitting: Pulmonary Disease

## 2014-09-05 VITALS — BP 124/66 | HR 89 | Ht 68.0 in | Wt 208.0 lb

## 2014-09-05 DIAGNOSIS — R21 Rash and other nonspecific skin eruption: Secondary | ICD-10-CM

## 2014-09-05 DIAGNOSIS — Z23 Encounter for immunization: Secondary | ICD-10-CM

## 2014-09-05 NOTE — Assessment & Plan Note (Signed)
He has severe COPD but he has recovered from his recent exacerbation of COPD. He is now back to his baseline.  Our workup after the last visit revealed peripheral eosinophilia. I advised him that this is been associated with worsening COPD outcomes. We actually advised that he consider enrollment in a clinical trial in [facility] for a biologic agent intended to treat this condition but he is not interested in pursuing this.  Plan: -Continue Symbicort -Continue loratadine -Considering the peripheral eosinophilia and purpuric rash I am going to check ANCA serology to look for Churg-Strauss disease (less likely). If this blood work is negative then he can resume Singulair. - Will followup final sputum AFB culture -Followup 6 months

## 2014-09-05 NOTE — Progress Notes (Signed)
Subjective:    Patient ID: Erik Gibbs, male    DOB: 1935-05-09, 79 y.o.   MRN: [id number]  Synopsis: GOLD grade D COPD first seen in [place] in 2015. Also noted to have mild peripheral eosinophilia with an absolute eosinophil count of 300.  HPI Chief Complaint  Patient presents with  . Follow-up    Pt c/o stable SOB with exertion, prod cough with green mucus unchanged since last visit.  Stopped anoro and restarted symbicort d/t increased coughing.    09/05/2014 ROV > Erik Gibbs said that his cough was worse after taking the Anoro so he switched back to the Symbicort and his cough is better.  He feels like in general he is back to baseline and is feeling well.  He is occasionally bringing up green mucus.  He says he feels good. His blood sugar has still been high.  Changing to Anoro did not help the blood sugar. He has had a rash on his arms for the last week which was itching. This has not been associated with fever, joint aches or weight loss.  Past Medical History  Diagnosis Date  . PVD (peripheral vascular disease)   . Hypertension   . Hyperlipidemia, mixed   . COPD (chronic obstructive pulmonary disease)   . Anemia   . GERD (gastroesophageal reflux disease)   . Diabetes mellitus       Review of Systems  Constitutional: Negative for fever, chills and fatigue.  HENT: Negative for postnasal drip, rhinorrhea and sinus pressure.   Respiratory: Positive for cough. Negative for shortness of breath and wheezing.   Cardiovascular: Negative for chest pain, palpitations and leg swelling.  Skin: Positive for rash.       Objective:   Physical Exam Filed Vitals:   09/05/14 1059  BP: 124/66  Pulse: 89  Height: 5\' 8"  (1.727 m)  Weight: 208 lb (94.348 kg)  SpO2: 95%   RA  Gen: well appearing, no acute distress HEENT: NCAT, EOMi, OP clear, PULM: CTA B CV: RRR, no mgr, no JVD AB: BS+, soft, nontender Ext: warm, no edema, no clubbing, no cyanosis Derm:pupuric rash over  extensor surface of forearm and upper arm bilatearlly or skin breakdown Neuro: A&Ox4, MAEW       Assessment & Plan:   COPD He has severe COPD but he has recovered from his recent exacerbation of COPD. He is now back to his baseline.  Our workup after the last visit revealed peripheral eosinophilia. I advised him that this is been associated with worsening COPD outcomes. We actually advised that he consider enrollment in a clinical trial in [facility] for a biologic agent intended to treat this condition but he is not interested in pursuing this.  Plan: -Continue Symbicort -Continue loratadine -Considering the peripheral eosinophilia and purpuric rash I am going to check ANCA serology to look for Churg-Strauss disease (less likely). If this blood work is negative then he can resume Singulair. - Will followup final sputum AFB culture -Followup 6 months  Rash and nonspecific skin eruption He has an unusual purpuric rash on his arms which apparently was initially itching. It does not look like a typical macular correction. Considering the eosinophilia, worsening wheezing in the last few months, and this rash I am going to screen for Churg-Strauss disease. If that test is negative then he may want to followup with a dermatologist. Erik Gibbs does not seem to be too bothered by this.    Updated Medication List Outpatient Encounter Prescriptions as  of 09/05/2014  Medication Sig  . albuterol (PROVENTIL,VENTOLIN) 90 MCG/ACT inhaler Inhale 2 puffs into the lungs every 6 (six) hours as needed for wheezing.  Marland Kitchen aspirin 81 MG tablet Take 81 mg by mouth daily.    . budesonide-formoterol (SYMBICORT) 160-4.5 MCG/ACT inhaler Inhale 1 puff into the lungs 2 (two) times daily.  . cilostazol (PLETAL) 100 MG tablet Take 100 mg by mouth 2 (two) times daily.   Marland Kitchen docusate sodium (COLACE) 100 MG capsule Take 100 mg by mouth 3 (three) times daily as needed.    . Ferrous Sulfate (IRON) 325 (65 FE) MG TABS Take  by mouth 3 (three) times daily.   Marland Kitchen glipiZIDE (GLUCOTROL) 10 MG tablet Take 10 mg by mouth daily.  . insulin NPH-insulin regular (NOVOLIN 70/30) (70-30) 100 UNIT/ML injection Inject into the skin as directed.    Marland Kitchen ipratropium-albuterol (DUONEB) 0.5-2.5 (3) MG/3ML SOLN Take 3 mLs by nebulization 3 (three) times daily.    Marland Kitchen lisinopril (PRINIVIL,ZESTRIL) 10 MG tablet Take 10 mg by mouth daily.    Marland Kitchen loratadine (CLARITIN) 10 MG tablet Take 10 mg by mouth daily.    . montelukast (SINGULAIR) 10 MG tablet Take 10 mg by mouth at bedtime.  . niacin 500 MG tablet Take 1,000 mg by mouth daily.   . Omega-3 Fatty Acids (FISH OIL) 1200 MG CAPS Take by mouth daily.    Marland Kitchen omeprazole (PRILOSEC) 20 MG capsule Take 20 mg by mouth daily.    Marland Kitchen pyridOXINE (VITAMIN B-6) 100 MG tablet Take 100 mg by mouth daily.    . simvastatin (ZOCOR) 40 MG tablet Take 20 mg by mouth at bedtime.

## 2014-09-05 NOTE — Assessment & Plan Note (Signed)
He has an unusual purpuric rash on his arms which apparently was initially itching. It does not look like a typical macular correction. Considering the eosinophilia, worsening wheezing in the last few months, and this rash I am going to screen for Churg-Strauss disease. If that test is negative then he may want to followup with a dermatologist. Erik Gibbs does not seem to be too bothered by this.

## 2014-09-05 NOTE — Patient Instructions (Signed)
Take the Symbicort as you are doing Don't take the singulair until you hear from [place] about the blood work from today We will see you back in 6 months or sooner if needed

## 2014-09-06 LAB — FUNGUS CULTURE W SMEAR: Smear Result: NONE SEEN

## 2014-09-06 LAB — ANCA SCREEN W REFLEX TITER
Atypical p-ANCA Screen: NEGATIVE
C-ANCA SCREEN: NEGATIVE
P-ANCA SCREEN: NEGATIVE

## 2014-09-10 NOTE — Progress Notes (Signed)
Quick Note:  Pt aware of results. Nothing further needed. ______ 

## 2014-09-19 LAB — CBC AND DIFFERENTIAL
HCT: 34 % — AB (ref 41–53)
HEMOGLOBIN: 11.4 g/dL — AB (ref 13.5–17.5)
Neutrophils Absolute: 5 /µL
Platelets: 245 10*3/uL (ref 150–399)
WBC: 7.8 10*3/mL

## 2014-09-19 LAB — BASIC METABOLIC PANEL
BUN: 33 mg/dL — AB (ref 4–21)
Creatinine: 1.8 mg/dL — AB (ref 0.6–1.3)
GLUCOSE: 221 mg/dL
Potassium: 5 mmol/L (ref 3.4–5.3)
Sodium: 141 mmol/L (ref 137–147)

## 2014-09-19 LAB — HEPATIC FUNCTION PANEL
ALT: 21 U/L (ref 10–40)
AST: 17 U/L (ref 14–40)
Alkaline Phosphatase: 52 U/L (ref 25–125)
BILIRUBIN, TOTAL: 0.2 mg/dL

## 2014-09-24 LAB — AFB CULTURE WITH SMEAR (NOT AT ARMC): Acid Fast Smear: NONE SEEN

## 2015-03-22 LAB — HEMOGLOBIN A1C: HEMOGLOBIN A1C: 6.3 % — AB (ref 4.0–6.0)

## 2015-04-09 DIAGNOSIS — N183 Chronic kidney disease, stage 3 unspecified: Secondary | ICD-10-CM | POA: Insufficient documentation

## 2015-04-09 DIAGNOSIS — D509 Iron deficiency anemia, unspecified: Secondary | ICD-10-CM | POA: Insufficient documentation

## 2015-04-09 DIAGNOSIS — IMO0002 Reserved for concepts with insufficient information to code with codable children: Secondary | ICD-10-CM | POA: Insufficient documentation

## 2015-04-09 DIAGNOSIS — K259 Gastric ulcer, unspecified as acute or chronic, without hemorrhage or perforation: Secondary | ICD-10-CM | POA: Insufficient documentation

## 2015-06-10 ENCOUNTER — Ambulatory Visit (INDEPENDENT_AMBULATORY_CARE_PROVIDER_SITE_OTHER): Payer: PPO | Admitting: Family Medicine

## 2015-06-10 ENCOUNTER — Encounter: Payer: Self-pay | Admitting: Family Medicine

## 2015-06-10 VITALS — BP 122/70 | HR 68 | Temp 98.3°F | Resp 16 | Ht 68.0 in | Wt 208.0 lb

## 2015-06-10 DIAGNOSIS — E1121 Type 2 diabetes mellitus with diabetic nephropathy: Secondary | ICD-10-CM

## 2015-06-10 DIAGNOSIS — D509 Iron deficiency anemia, unspecified: Secondary | ICD-10-CM | POA: Diagnosis not present

## 2015-06-10 DIAGNOSIS — G629 Polyneuropathy, unspecified: Secondary | ICD-10-CM | POA: Diagnosis not present

## 2015-06-10 DIAGNOSIS — E782 Mixed hyperlipidemia: Secondary | ICD-10-CM | POA: Diagnosis not present

## 2015-06-10 DIAGNOSIS — N189 Chronic kidney disease, unspecified: Secondary | ICD-10-CM

## 2015-06-10 DIAGNOSIS — D692 Other nonthrombocytopenic purpura: Secondary | ICD-10-CM | POA: Diagnosis not present

## 2015-06-10 DIAGNOSIS — E1122 Type 2 diabetes mellitus with diabetic chronic kidney disease: Secondary | ICD-10-CM

## 2015-06-10 MED ORDER — GABAPENTIN 300 MG PO CAPS: 300.0000 mg | ORAL_CAPSULE | Freq: Every day | ORAL | Status: DC

## 2015-06-10 NOTE — Progress Notes (Signed)
Patient ID: Erik Gibbs, male   DOB: 20-Feb-1935, 80 y.o.   MRN: [id number]        Patient: Erik Gibbs, Male    DOB: 1935-01-04, 80 y.o.   MRN: [id number] Visit Date: 06/10/2015  Today's Provider: Margarita Rana, MD   No chief complaint on file.  Subjective:    Annual wellness visit Erik Gibbs is a 80 y.o. male who presents today for his Subsequent Annual Wellness Visit. He feels fairly well.  Pt reports having trouble with diabetic nerve pain.  It started about three weeks ago.   He reports exercising on the weekends. He reports he is sleeping well. Would like start Gabapentin for this. Has not has labs done to check for etiology.  Does have easy bruising on arms, but this has not changed.   -----------------------------------------------------------   Review of Systems  Constitutional: Negative.   HENT: Negative.   Eyes: Negative.   Respiratory: Positive for cough.   Cardiovascular: Negative.   Gastrointestinal: Negative.   Endocrine: Negative.   Genitourinary: Negative.   Allergic/Immunologic: Positive for environmental allergies.  Neurological: Positive for numbness.  Hematological: Bruises/bleeds easily.  Psychiatric/Behavioral: Negative.     History   Social History  . Marital Status: Married    Spouse Name: [name]  . Number of Children: 4  . Years of Education: 6th Grade   Occupational History  . Retired    Social History Main Topics  . Smoking status: Former Smoker -- 3.00 packs/day for 62 years    Types: Cigarettes    Quit date: 08/08/2010  . Smokeless tobacco: Never Used  . Alcohol Use: No     Comment: former heavy alcohol use  . Drug Use: No  . Sexual Activity: No   Other Topics Concern  . Not on file   Social History Narrative   Married   Gets regular exercise    Patient Active Problem List   Diagnosis Date Noted  . Neuropathy 06/10/2015  . Diabetes mellitus with diabetic nephropathy 06/10/2015  . Senile purpura 06/10/2015  .  Anemia, iron deficiency 04/09/2015  . Adult BMI 30+ 04/09/2015  . Chronic kidney disease (CKD), stage III (moderate) 04/09/2015  . Gastric ulcer 04/09/2015  . Rash and nonspecific skin eruption 09/05/2014  . Bladder CA in situ 12/13/2012  . TOBACCO ABUSE 11/05/2009  . Benign hypertension with CKD (chronic kidney disease) stage III 04/18/2009  . PVD 04/18/2009  . COPD 04/18/2009  . GERD 04/18/2009  . Combined fat and carbohydrate induced hyperlipemia 09/28/2008  . Allergic rhinitis 12/26/2007  . CAFL (chronic airflow limitation) 12/26/2007  . H/O primary malignant neoplasm of urinary bladder 12/26/2007  . History of colon polyps 12/26/2007  . H/O malignant neoplasm of prostate 12/26/2007  . Heart & renal disease, hypertensive, with heart failure 12/26/2007  . Angiopathy, peripheral 12/26/2007    Past Surgical History  Procedure Laterality Date  . Prostatectomy  1995  . Bladder tumor excision  2007  . Testicle removal Bilateral     Dr. August Saucer    His family history includes Asthma in his son; COPD in his brother; Cancer in his father; Depression in his mother; Diabetes in his brother and other; Emphysema in his father; Heart attack in his brother; Heart disease in his mother; Heart failure in his other; Ulcers in his mother.    Previous Medications   ALBUTEROL (PROVENTIL,VENTOLIN) 90 MCG/ACT INHALER    Inhale 2 puffs into the lungs every 6 (six) hours as needed for  wheezing.   ASPIRIN 81 MG TABLET    Take 81 mg by mouth daily.     BUDESONIDE-FORMOTEROL (SYMBICORT) 160-4.5 MCG/ACT INHALER    Inhale 1 puff into the lungs 2 (two) times daily.   CILOSTAZOL (PLETAL) 100 MG TABLET    Take 100 mg by mouth 2 (two) times daily.    DOCUSATE SODIUM (COLACE) 50 MG CAPSULE    DOCUSATE SODIUM, 50MG  (Oral Capsule)  one every day for 0 days  Quantity: 0.00;  Refills: 0   Ordered :14-Jan-2011  Teena Dunk ;  Started 26-Jun-2009 Active   FAMOTIDINE (PEPCID) 20 MG TABLET    Take by mouth.    FERROUS SULFATE (IRON) 325 (65 FE) MG TABS    Take by mouth 3 (three) times daily.    GLUCOSE BLOOD TEST STRIP    FREESTYLE LITE TEST (In Vitro Strip)  1 (one) Strip Strip to check sugars bid for 0 days  Quantity: 200;  Refills: 3   Ordered :19-Sep-2014  [name] ;  Started 19-Sep-2014 Active Comments: Patient has insulin-depedent type 2 diabetes.  (E11.9) Thanks- Dr. Jerilynn Mages.   INSULIN NPH-INSULIN REGULAR (NOVOLIN 70/30) (70-30) 100 UNIT/ML INJECTION    Inject into the skin as directed.     IPRATROPIUM-ALBUTEROL (DUONEB) 0.5-2.5 (3) MG/3ML SOLN    Take 3 mLs by nebulization 3 (three) times daily.     IRON POLYSACCHARIDES (NIFEREX) 150 MG CAPSULE    Take by mouth.   LISINOPRIL (PRINIVIL,ZESTRIL) 10 MG TABLET    Take 10 mg by mouth daily.     LORATADINE (CLARITIN) 10 MG TABLET    Take 10 mg by mouth daily.     MONTELUKAST (SINGULAIR) 10 MG TABLET    Take 10 mg by mouth at bedtime.   NIACIN 500 MG CR CAPSULE    NIACIN, 500MG  (Oral Capsule Extended Release)  1 Every Day for 0 days  Quantity: 0.00;  Refills: 0   Ordered :08-Oct-2010  [name] ;  Started 28-Mar-2009 Active   OMEGA-3 FATTY ACIDS (FISH OIL) 1200 MG CAPS    Take by mouth daily.     OMEPRAZOLE (PRILOSEC) 20 MG CAPSULE    Take 20 mg by mouth daily.     PYRIDOXINE (VITAMIN B-6) 100 MG TABLET    Take 100 mg by mouth daily.     SIMVASTATIN (ZOCOR) 20 MG TABLET    Take by mouth.    Patient Care Team: Margarita Rana, MD as PCP - General (Family Medicine)     Objective:   Vitals: BP 122/70 mmHg  Pulse 68  Temp(Src) 98.3 F (36.8 C) (Oral)  Resp 16  Ht 5\' 8"  (1.727 m)  Wt 208 lb (94.348 kg)  BMI 31.63 kg/m2  Physical Exam  Constitutional: He is oriented to person, place, and time. He appears well-developed and well-nourished.  Cardiovascular: Normal rate and regular rhythm.   Pulmonary/Chest: Effort normal and breath sounds normal.  Breath sounds decreased throughout.   Abdominal: Soft.  Neurological: He is alert and oriented  to person, place, and time.  Skin: Skin is warm and dry.  Bruising on arms noted.   Psychiatric: He has a normal mood and affect. His behavior is normal. Judgment and thought content normal.    Activities of Daily Living In your present state of health, do you have any difficulty performing the following activities: 06/10/2015  Hearing? N  Vision? N  Difficulty concentrating or making decisions? N  Walking or climbing stairs? Y  Dressing or bathing?  N  Doing errands, shopping? N    Fall Risk Assessment Fall Risk  06/10/2015  Falls in the past year? No     Depression Screen PHQ 2/9 Scores 06/10/2015  PHQ - 2 Score 0    Cognitive Testing - 6-CIT  Correct? Score   What year is it? yes 0 0 or 4  What month is it? yes 0 0 or 3  Memorize:    [place]ia [place],  44,  [place], yes  0   What time is it? (within 1 hour) yes 0 0 or 3  Count backwards from 20 no 2 0, 2, or 4  Name the months of the year no 4 0, 2, or 4  Repeat name & address above yes 0 0, 2, 4, 6, 8, or 10       TOTAL SCORE  6/28   Interpretation:  Normal  Normal (0-7) Abnormal (8-28)       Assessment & Plan:     Annual Wellness Visit  Reviewed patient's Family Medical History Reviewed and updated list of patient's medical providers Assessment of cognitive impairment was done Assessed patient's functional ability Established a written schedule for health screening [place] Completed and Reviewed  Exercise Activities and Dietary recommendations Goals    None      Immunization History  Administered Date(s) Administered  . Influenza,inj,Quad PF,36+ Mos 08/08/2014  . Pneumococcal Conjugate-13 09/05/2014  . Pneumococcal Polysaccharide-23 11/16/2007  . Pneumococcal-Unspecified 08/09/2011  . Tdap 02/22/2012    Health Maintenance  Topic Date Due  . FOOT EXAM  07/25/1947  . OPHTHALMOLOGY EXAM  07/25/1947  . URINE MICROALBUMIN  07/25/1947  . COLONOSCOPY  07/25/1987  .  ZOSTAVAX  07/24/1997  . INFLUENZA VACCINE  06/24/2015  . HEMOGLOBIN A1C  09/21/2015  . TETANUS/TDAP  02/21/2022  . PNA vac Low Risk Adult  Completed   1. Neuropathy Will check labs. Start medication.  - Vitamin B12 - TSH - gabapentin (NEURONTIN) 300 MG capsule; Take 1 capsule (300 mg total) by mouth at bedtime.  Dispense: 30 capsule; Refill: 5  2. Type 2 diabetes mellitus with diabetic nephropathy Will recheck A1c in next 2 months. Follow up with nephrologist as scheduled.  - Comprehensive metabolic panel  4. Senile purpura Stable.   5. Anemia, iron deficiency Will check labs.  - CBC with Differential/Platelet - Ferritin - Iron and TIBC  6. Combined fat and carbohydrate induced hyperlipemia Time to recheck labs.  - Lipid panel    Discussed health benefits of physical activity, and encouraged him to engage in regular exercise appropriate for his age and condition.   Margarita Rana, MD    ------------------------------------------------------------------------------------------------------------

## 2015-06-11 ENCOUNTER — Other Ambulatory Visit: Payer: Self-pay

## 2015-06-11 DIAGNOSIS — G629 Polyneuropathy, unspecified: Secondary | ICD-10-CM

## 2015-06-11 MED ORDER — GABAPENTIN 300 MG PO CAPS: 300.0000 mg | ORAL_CAPSULE | Freq: Every day | ORAL | Status: DC

## 2015-06-13 ENCOUNTER — Other Ambulatory Visit: Payer: Self-pay | Admitting: Family Medicine

## 2015-06-14 LAB — TSH: TSH: 1.44 u[IU]/mL (ref 0.450–4.500)

## 2015-06-14 LAB — CBC WITH DIFFERENTIAL/PLATELET
BASOS ABS: 0 10*3/uL (ref 0.0–0.2)
BASOS: 1 %
EOS (ABSOLUTE): 0.4 10*3/uL (ref 0.0–0.4)
Eos: 6 %
Hematocrit: 31.6 % — ABNORMAL LOW (ref 37.5–51.0)
Hemoglobin: 10.3 g/dL — ABNORMAL LOW (ref 12.6–17.7)
Immature Grans (Abs): 0 10*3/uL (ref 0.0–0.1)
Immature Granulocytes: 0 %
LYMPHS: 28 %
Lymphocytes Absolute: 1.8 10*3/uL (ref 0.7–3.1)
MCH: 31.7 pg (ref 26.6–33.0)
MCHC: 32.6 g/dL (ref 31.5–35.7)
MCV: 97 fL (ref 79–97)
MONOCYTES: 7 %
Monocytes Absolute: 0.4 10*3/uL (ref 0.1–0.9)
Neutrophils Absolute: 3.9 10*3/uL (ref 1.4–7.0)
Neutrophils: 58 %
Platelets: 232 10*3/uL (ref 150–379)
RBC: 3.25 x10E6/uL — ABNORMAL LOW (ref 4.14–5.80)
RDW: 14.2 % (ref 12.3–15.4)
WBC: 6.6 10*3/uL (ref 3.4–10.8)

## 2015-06-14 LAB — COMPREHENSIVE METABOLIC PANEL
ALK PHOS: 49 IU/L (ref 39–117)
ALT: 18 IU/L (ref 0–44)
AST: 14 IU/L (ref 0–40)
Albumin/Globulin Ratio: 1.5 (ref 1.1–2.5)
Albumin: 4.2 g/dL (ref 3.5–4.8)
BUN/Creatinine Ratio: 14 (ref 10–22)
BUN: 26 mg/dL (ref 8–27)
Bilirubin Total: 0.3 mg/dL (ref 0.0–1.2)
CHLORIDE: 102 mmol/L (ref 97–108)
CO2: 19 mmol/L (ref 18–29)
Calcium: 9.5 mg/dL (ref 8.6–10.2)
Creatinine, Ser: 1.89 mg/dL — ABNORMAL HIGH (ref 0.76–1.27)
GFR calc non Af Amer: 33 mL/min/{1.73_m2} — ABNORMAL LOW (ref >59)
GFR, EST AFRICAN AMERICAN: 39 mL/min/{1.73_m2} — AB (ref >59)
GLOBULIN, TOTAL: 2.8 g/dL (ref 1.5–4.5)
GLUCOSE: 175 mg/dL — AB (ref 65–99)
POTASSIUM: 5.7 mmol/L — AB (ref 3.5–5.2)
SODIUM: 139 mmol/L (ref 134–144)
Total Protein: 7 g/dL (ref 6.0–8.5)

## 2015-06-14 LAB — LIPID PANEL
Chol/HDL Ratio: 2.3 ratio units (ref 0.0–5.0)
Cholesterol, Total: 123 mg/dL (ref 100–199)
HDL: 53 mg/dL (ref >39)
LDL Calculated: 53 mg/dL (ref 0–99)
Triglycerides: 83 mg/dL (ref 0–149)
VLDL Cholesterol Cal: 17 mg/dL (ref 5–40)

## 2015-06-14 LAB — IRON AND TIBC
Iron Saturation: 23 % (ref 15–55)
Iron: 78 ug/dL (ref 38–169)
TIBC: 335 ug/dL (ref 250–450)
UIBC: 257 ug/dL (ref 111–343)

## 2015-06-14 LAB — VITAMIN B12: VITAMIN B 12: 259 pg/mL (ref 211–946)

## 2015-06-14 LAB — FERRITIN: Ferritin: 32 ng/mL (ref 30–400)

## 2015-06-19 ENCOUNTER — Other Ambulatory Visit: Payer: Self-pay | Admitting: Family Medicine

## 2015-06-19 ENCOUNTER — Other Ambulatory Visit: Payer: Self-pay

## 2015-06-19 ENCOUNTER — Telehealth: Payer: Self-pay

## 2015-06-19 DIAGNOSIS — E875 Hyperkalemia: Secondary | ICD-10-CM

## 2015-06-19 NOTE — Telephone Encounter (Signed)
-----   Message from Margarita Rana, MD sent at 06/17/2015  8:54 AM EDT ----- Labs stable except for elevated potassium. Could be lab drawing error. Recheck this week to be sure and B12 ok on low end. Would recommend start B12 supplement 1000 mcg daily. Thanks.

## 2015-06-19 NOTE — Telephone Encounter (Signed)
Not usual side effect but if helping pain symptoms would continue to adjust insulin as needed.

## 2015-06-19 NOTE — Telephone Encounter (Signed)
Mrs. Haarmann advised as directed below.  Lab sheet up at the front desk.   Thanks,   -Mickel Baas

## 2015-06-19 NOTE — Telephone Encounter (Signed)
Mrs. Arcos reported that Deshay's blood sugar has been elevated since starting Gabapentin.  She was wondering if that could be a side effect.  His blood sugars have been running the high 100's to low-mid 200's.  She has increased his insulin to 45 units twice a day and his sugars have improved to 130's.   Thanks,   -Mickel Baas

## 2015-06-20 NOTE — Telephone Encounter (Signed)
Mrs. Paulik advised as directed below.   Thanks,   -Mickel Baas

## 2015-06-27 LAB — COMPREHENSIVE METABOLIC PANEL
ALBUMIN: 4.2 g/dL (ref 3.5–4.8)
ALT: 17 IU/L (ref 0–44)
AST: 13 IU/L (ref 0–40)
Albumin/Globulin Ratio: 1.5 (ref 1.1–2.5)
Alkaline Phosphatase: 51 IU/L (ref 39–117)
BUN/Creatinine Ratio: 13 (ref 10–22)
BUN: 22 mg/dL (ref 8–27)
CALCIUM: 9.4 mg/dL (ref 8.6–10.2)
CO2: 23 mmol/L (ref 18–29)
Chloride: 102 mmol/L (ref 97–108)
Creatinine, Ser: 1.76 mg/dL — ABNORMAL HIGH (ref 0.76–1.27)
GFR calc Af Amer: 42 mL/min/{1.73_m2} — ABNORMAL LOW (ref >59)
GFR, EST NON AFRICAN AMERICAN: 36 mL/min/{1.73_m2} — AB (ref >59)
GLOBULIN, TOTAL: 2.8 g/dL (ref 1.5–4.5)
Glucose: 229 mg/dL — ABNORMAL HIGH (ref 65–99)
POTASSIUM: 5 mmol/L (ref 3.5–5.2)
SODIUM: 142 mmol/L (ref 134–144)
TOTAL PROTEIN: 7 g/dL (ref 6.0–8.5)

## 2015-08-12 ENCOUNTER — Encounter: Payer: Self-pay | Admitting: Family Medicine

## 2015-08-12 ENCOUNTER — Ambulatory Visit (INDEPENDENT_AMBULATORY_CARE_PROVIDER_SITE_OTHER): Payer: PPO | Admitting: Family Medicine

## 2015-08-12 VITALS — BP 130/64 | HR 76 | Temp 98.6°F | Resp 16 | Wt 210.0 lb

## 2015-08-12 DIAGNOSIS — N183 Chronic kidney disease, stage 3 (moderate): Secondary | ICD-10-CM | POA: Diagnosis not present

## 2015-08-12 DIAGNOSIS — I129 Hypertensive chronic kidney disease with stage 1 through stage 4 chronic kidney disease, or unspecified chronic kidney disease: Secondary | ICD-10-CM

## 2015-08-12 DIAGNOSIS — G629 Polyneuropathy, unspecified: Secondary | ICD-10-CM

## 2015-08-12 DIAGNOSIS — Z23 Encounter for immunization: Secondary | ICD-10-CM | POA: Diagnosis not present

## 2015-08-12 DIAGNOSIS — E1121 Type 2 diabetes mellitus with diabetic nephropathy: Secondary | ICD-10-CM | POA: Diagnosis not present

## 2015-08-12 LAB — POCT GLYCOSYLATED HEMOGLOBIN (HGB A1C): Hemoglobin A1C: 6.8

## 2015-08-12 NOTE — Progress Notes (Signed)
Patient ID: [name], male   DOB: 04-Sep-1935, 80 y.o.   MRN: [id number]        Patient: Erik Gibbs Male    DOB: Jun 09, 1935   80 y.o.   MRN: [id number] Visit Date: 08/12/2015  Today's Provider: Margarita Rana, MD   Chief Complaint  Patient presents with  . Diabetes  . Peripheral Neuropathy  . Hyperlipidemia   Subjective:    HPI  Diabetes Mellitus Type II, Follow-up:   Lab Results  Component Value Date   HGBA1C 6.8 08/12/2015   HGBA1C 6.3* 03/22/2015    Last seen for diabetes 2 months ago.  Management since then includes starting Gabapentin. He reports excellent compliance with treatment. He is not having side effects.  Current symptoms include none and have been stable. Home blood sugar records: Mid's 100's - 200's  Episodes of hypoglycemia? no   Current Insulin Regimen: none Most Recent Eye Exam: 1 Year ago Weight trend: stable Prior visit with dietician: no Current diet: in general, a "healthy" diet   Current exercise:   Pertinent Labs:    Component Value Date/Time   CHOL 123 06/13/2015 0806   TRIG 83 06/13/2015 0806   CHOLHDL 2.3 06/13/2015 0806   CREATININE 1.76* 06/26/2015 0939   CREATININE 1.8* 09/19/2014    Wt Readings from Last 3 Encounters:  08/12/15 210 lb (95.255 kg)  06/10/15 208 lb (94.348 kg)  03/22/15 209 lb (94.802 kg)     Hypertension, follow-up:  BP Readings from Last 3 Encounters:  08/12/15 130/64  06/10/15 122/70  03/22/15 126/72    He was last seen for hypertension 2 months ago.  BP at that visit was 122/70. Management changes since that visit include checking labs. He reports excellent compliance with treatment. He is not having side effects.  He is exercising. He is adherent to low salt diet.   Outside blood pressures are 120/80's. He is experiencing  Patient denies none.   Cardiovascular risk factors include diabetes mellitus, dyslipidemia, hypertension and male gender.  Use of agents associated with  hypertension: none.     Also here for COPD recheck. Has decreased Symbicort to once a day. Breathing stable.  Using nebulizer also.  Takes all of his medication without any difficulty. Does seem more stable in office today. Does not need to go to car emergently.   ------------------------------------------------------------------------          Allergies  Allergen Reactions  . Amlodipine     BP drops severly  . Nitrofuran Derivatives   . Povidone-Iodine    Previous Medications   ALBUTEROL (PROVENTIL,VENTOLIN) 90 MCG/ACT INHALER    Inhale 2 puffs into the lungs every 6 (six) hours as needed for wheezing.   ASPIRIN 81 MG TABLET    Take 81 mg by mouth daily.     BUDESONIDE-FORMOTEROL (SYMBICORT) 160-4.5 MCG/ACT INHALER    Inhale 1 puff into the lungs 2 (two) times daily.   CILOSTAZOL (PLETAL) 100 MG TABLET    Take 100 mg by mouth 2 (two) times daily.    DOCUSATE SODIUM (COLACE) 50 MG CAPSULE    DOCUSATE SODIUM, 50MG  (Oral Capsule)  one every day for 0 days  Quantity: 0.00;  Refills: 0   Ordered :14-Jan-2011  Teena Dunk ;  Started 26-Jun-2009 Active   FAMOTIDINE (PEPCID) 20 MG TABLET    Take by mouth.   FERROUS SULFATE (IRON) 325 (65 FE) MG TABS    Take by mouth 3 (three) times daily.    GABAPENTIN (  NEURONTIN) 300 MG CAPSULE    Take 1 capsule (300 mg total) by mouth at bedtime.   GLUCOSE BLOOD TEST STRIP    FREESTYLE LITE TEST (In Vitro Strip)  1 (one) Strip Strip to check sugars bid for 0 days  Quantity: 200;  Refills: 3   Ordered :19-Sep-2014  [name] ;  Started 19-Sep-2014 Active Comments: Patient has insulin-depedent type 2 diabetes.  (E11.9) Thanks- Dr. Jerilynn Mages.   INSULIN NPH-INSULIN REGULAR (NOVOLIN 70/30) (70-30) 100 UNIT/ML INJECTION    Inject into the skin as directed.     IPRATROPIUM-ALBUTEROL (DUONEB) 0.5-2.5 (3) MG/3ML SOLN    Take 3 mLs by nebulization 3 (three) times daily.     IRON POLYSACCHARIDES (NIFEREX) 150 MG CAPSULE    Take by mouth.   LISINOPRIL  (PRINIVIL,ZESTRIL) 10 MG TABLET    Take 10 mg by mouth daily.     LORATADINE (CLARITIN) 10 MG TABLET    Take 10 mg by mouth daily.     MONTELUKAST (SINGULAIR) 10 MG TABLET    Take 10 mg by mouth at bedtime.   NIACIN 500 MG CR CAPSULE    NIACIN, 500MG  (Oral Capsule Extended Release)  1 Every Day for 0 days  Quantity: 0.00;  Refills: 0   Ordered :08-Oct-2010  [name] ;  Started 28-Mar-2009 Active   OMEGA-3 FATTY ACIDS (FISH OIL) 1200 MG CAPS    Take by mouth daily.     OMEPRAZOLE (PRILOSEC) 20 MG CAPSULE    Take 20 mg by mouth daily.     PYRIDOXINE (VITAMIN B-6) 100 MG TABLET    Take 100 mg by mouth daily.     SIMVASTATIN (ZOCOR) 20 MG TABLET    Take by mouth.    Review of Systems  Constitutional: Negative.   Respiratory: Positive for shortness of breath and wheezing. Negative for apnea, cough, choking, chest tightness and stridor.        History of COPD  Cardiovascular: Negative.   Gastrointestinal: Negative.   Endocrine: Negative.   Musculoskeletal: Negative.     Social History  Substance Use Topics  . Smoking status: Former Smoker -- 3.00 packs/day for 62 years    Types: Cigarettes    Quit date: 08/08/2010  . Smokeless tobacco: Never Used  . Alcohol Use: No     Comment: former heavy alcohol use   Objective:   BP 130/64 mmHg  Pulse 76  Temp(Src) 98.6 F (37 C) (Oral)  Resp 16  Wt 210 lb (95.255 kg)   Results for orders placed or performed in visit on 08/12/15  POCT glycosylated hemoglobin (Hb A1C)  Result Value Ref Range   Hemoglobin A1C 6.8      Physical Exam  Constitutional: He appears well-developed and well-nourished.  Neck: Carotid bruit is not present.  Cardiovascular: Normal rate, regular rhythm, normal heart sounds and normal pulses.   Pulmonary/Chest: Effort normal. He has wheezes (Prolonged expiratory phase; slight scattered wheezes. ).  Psychiatric: He has a normal mood and affect. His speech is normal and behavior is normal. Judgment and thought  content normal. Cognition and memory are normal.        Assessment & Plan:      1. Type 2 diabetes mellitus with diabetic nephropathy A1C stable at 6.8%; continue current medications recheck in 3 months. No lows. Feels well.   - POCT glycosylated hemoglobin (Hb A1C)  2. Neuropathy Doing well on Gabapentin; continue current medications.   3. Benign hypertension with CKD (chronic kidney disease) stage  III Stable.  Labs stable.    Flu shot today.      Patient was seen and examined by Jerrell Belfast, MD, and note scribed by [name], CMA.  I have reviewed the document for accuracy and completeness and I agree with above. - Jerrell Belfast, MD   Margarita Rana, MD  [facility]

## 2015-11-13 ENCOUNTER — Encounter: Payer: Self-pay | Admitting: Family Medicine

## 2015-11-13 ENCOUNTER — Ambulatory Visit (INDEPENDENT_AMBULATORY_CARE_PROVIDER_SITE_OTHER): Payer: PPO | Admitting: Family Medicine

## 2015-11-13 VITALS — BP 122/68 | HR 84 | Temp 99.0°F | Resp 16 | Wt 209.0 lb

## 2015-11-13 DIAGNOSIS — N183 Chronic kidney disease, stage 3 unspecified: Secondary | ICD-10-CM

## 2015-11-13 DIAGNOSIS — G629 Polyneuropathy, unspecified: Secondary | ICD-10-CM

## 2015-11-13 DIAGNOSIS — Z794 Long term (current) use of insulin: Secondary | ICD-10-CM | POA: Diagnosis not present

## 2015-11-13 DIAGNOSIS — E785 Hyperlipidemia, unspecified: Secondary | ICD-10-CM

## 2015-11-13 DIAGNOSIS — D509 Iron deficiency anemia, unspecified: Secondary | ICD-10-CM | POA: Diagnosis not present

## 2015-11-13 DIAGNOSIS — K219 Gastro-esophageal reflux disease without esophagitis: Secondary | ICD-10-CM

## 2015-11-13 DIAGNOSIS — I129 Hypertensive chronic kidney disease with stage 1 through stage 4 chronic kidney disease, or unspecified chronic kidney disease: Secondary | ICD-10-CM

## 2015-11-13 DIAGNOSIS — J449 Chronic obstructive pulmonary disease, unspecified: Secondary | ICD-10-CM

## 2015-11-13 DIAGNOSIS — E1121 Type 2 diabetes mellitus with diabetic nephropathy: Secondary | ICD-10-CM | POA: Diagnosis not present

## 2015-11-13 LAB — POCT GLYCOSYLATED HEMOGLOBIN (HGB A1C): Hemoglobin A1C: 7.2

## 2015-11-13 MED ORDER — IRON 325 (65 FE) MG PO TABS: 1.0000 | ORAL_TABLET | Freq: Three times a day (TID) | ORAL | Status: DC

## 2015-11-13 MED ORDER — SIMVASTATIN 20 MG PO TABS: 20.0000 mg | ORAL_TABLET | Freq: Every day | ORAL | Status: DC

## 2015-11-13 MED ORDER — MONTELUKAST SODIUM 10 MG PO TABS: 10.0000 mg | ORAL_TABLET | Freq: Every day | ORAL | Status: DC

## 2015-11-13 MED ORDER — CILOSTAZOL 100 MG PO TABS: 100.0000 mg | ORAL_TABLET | Freq: Two times a day (BID) | ORAL | Status: DC

## 2015-11-13 MED ORDER — IPRATROPIUM-ALBUTEROL 0.5-2.5 (3) MG/3ML IN SOLN: 3.0000 mL | Freq: Three times a day (TID) | RESPIRATORY_TRACT | Status: DC

## 2015-11-13 MED ORDER — LISINOPRIL 10 MG PO TABS: 10.0000 mg | ORAL_TABLET | Freq: Every day | ORAL | Status: DC

## 2015-11-13 MED ORDER — GABAPENTIN 300 MG PO CAPS: 300.0000 mg | ORAL_CAPSULE | Freq: Every day | ORAL | Status: DC

## 2015-11-13 NOTE — Progress Notes (Signed)
Patient: Erik Gibbs Male    DOB: 18-Jan-1935   80 y.o.   MRN: [id number] Visit Date: 11/13/2015  Today's Provider: Margarita Rana, MD   Chief Complaint  Patient presents with  . Hypertension  . Hyperlipidemia  . Diabetes   Subjective:    Hypertension This is a chronic problem. The problem is unchanged. The problem is controlled. Associated symptoms include shortness of breath. Pertinent negatives include no headaches. Risk factors for coronary artery disease include diabetes mellitus, dyslipidemia and obesity. Past treatments include ACE inhibitors. There are no compliance problems.   Hyperlipidemia This is a chronic problem. The problem is controlled. Associated symptoms include shortness of breath. There are no compliance problems.  Risk factors for coronary artery disease include dyslipidemia, diabetes mellitus, hypertension, male sex and obesity.  Diabetes He presents for his follow-up diabetic visit. He has type 2 diabetes mellitus. His disease course has been stable. There are no hypoglycemic associated symptoms. Pertinent negatives for hypoglycemia include no dizziness or headaches. Symptoms are stable. Risk factors for coronary artery disease include diabetes mellitus, dyslipidemia, male sex and hypertension. He is compliant with treatment all of the time. (Blood sugars usually run in the mid 100's. ) An ACE inhibitor/angiotensin II receptor blocker is being taken.     Lab Results  Component Value Date   HGBA1C 7.2 11/13/2015   Lab Results  Component Value Date   CHOL 123 06/13/2015   HDL 53 06/13/2015   LDLCALC 53 06/13/2015   TRIG 83 06/13/2015   CHOLHDL 2.3 06/13/2015        Allergies  Allergen Reactions  . Amlodipine     BP drops severly  . Nitrofuran Derivatives   . Povidone-Iodine    Previous Medications   ALBUTEROL (PROVENTIL,VENTOLIN) 90 MCG/ACT INHALER    Inhale 2 puffs into the lungs every 6 (six) hours as needed for wheezing.   ASPIRIN 81 MG TABLET    Take 81 mg by mouth daily.     BUDESONIDE-FORMOTEROL (SYMBICORT) 160-4.5 MCG/ACT INHALER    Inhale 1 puff into the lungs 2 (two) times daily.   DOCUSATE SODIUM (COLACE) 50 MG CAPSULE    DOCUSATE SODIUM, 50MG  (Oral Capsule)  one every day for 0 days  Quantity: 0.00;  Refills: 0   Ordered :14-Jan-2011  Teena Dunk ;  Started 26-Jun-2009 Active   FAMOTIDINE (PEPCID) 20 MG TABLET    Take by mouth.   GLUCOSE BLOOD TEST STRIP    FREESTYLE LITE TEST (In Vitro Strip)  1 (one) Strip Strip to check sugars bid for 0 days  Quantity: 200;  Refills: 3   Ordered :19-Sep-2014  [name] ;  Started 19-Sep-2014 Active Comments: Patient has insulin-depedent type 2 diabetes.  (E11.9) Thanks- Dr. Jerilynn Mages.   INSULIN NPH-INSULIN REGULAR (NOVOLIN 70/30) (70-30) 100 UNIT/ML INJECTION    Inject into the skin as directed.     IRON POLYSACCHARIDES (NIFEREX) 150 MG CAPSULE    Take 150 mg by mouth 3 (three) times daily.   LORATADINE (CLARITIN) 10 MG TABLET    Take 10 mg by mouth daily.     NIACIN 500 MG CR CAPSULE    NIACIN, 500MG  (Oral Capsule Extended Release)  1 Every Day for 0 days  Quantity: 0.00;  Refills: 0   Ordered :08-Oct-2010  [name] ;  Started 28-Mar-2009 Active   OMEGA-3 FATTY ACIDS (FISH OIL) 1200 MG CAPS    Take by mouth daily.     PYRIDOXINE (  VITAMIN B-6) 100 MG TABLET    Take 100 mg by mouth daily.      Review of Systems  Constitutional: Negative.   Eyes: Negative.   Respiratory: Positive for cough, shortness of breath and wheezing.        Pt says his breathing is under good control.   Cardiovascular: Negative.   Gastrointestinal: Negative.   Neurological: Negative for dizziness, light-headedness, numbness and headaches.    Social History  Substance Use Topics  . Smoking status: Former Smoker -- 3.00 packs/day for 62 years    Types: Cigarettes    Quit date: 08/08/2010  . Smokeless tobacco: Never Used  . Alcohol Use: No     Comment: former heavy alcohol use    Objective:   BP 122/68 mmHg  Pulse 84  Temp(Src) 99 F (37.2 C) (Oral)  Resp 16  Wt 209 lb (94.802 kg)  Physical Exam  Constitutional: He appears well-developed and well-nourished.  Neck: Carotid bruit is not present.  Cardiovascular: Normal rate, regular rhythm, normal heart sounds and normal pulses.   Pulmonary/Chest: Effort normal. He has wheezes (Prolonged expiratory phase; slight scattered wheezes. ).  Psychiatric: He has a normal mood and affect. His speech is normal and behavior is normal. Judgment and thought content normal. Cognition and memory are normal.        Assessment & Plan:     1. Benign hypertension with CKD (chronic kidney disease) stage III Condition is stable. Please continue current medication and  plan of care as noted.   - lisinopril (PRINIVIL,ZESTRIL) 10 MG tablet; Take 1 tablet (10 mg total) by mouth daily.  Dispense: 90 tablet; Refill: 3  2. Type 2 diabetes mellitus with diabetic nephropathy, with long-term current use of insulin ([place]) Not as good as previous. Will work on lifestyle and recheck in 3 months.   - POCT glycosylated hemoglobin (Hb A1C) - cilostazol (PLETAL) 100 MG tablet; Take 1 tablet (100 mg total) by mouth 2 (two) times daily.  Dispense: 180 tablet; Refill: 3 Results for orders placed or performed in visit on 11/13/15  POCT glycosylated hemoglobin (Hb A1C)  Result Value Ref Range   Hemoglobin A1C 7.2     3. Neuropathy (HCC) Condition is stable. Please continue current medication and  plan of care as noted.   - cilostazol (PLETAL) 100 MG tablet; Take 1 tablet (100 mg total) by mouth 2 (two) times daily.  Dispense: 180 tablet; Refill: 3 - gabapentin (NEURONTIN) 300 MG capsule; Take 1 capsule (300 mg total) by mouth at bedtime.  Dispense: 90 capsule; Refill: 3  4. Chronic obstructive pulmonary disease, unspecified COPD type ([place]) Condition is stable. Please continue current medication and  plan of care as noted.   - montelukast  (SINGULAIR) 10 MG tablet; Take 1 tablet (10 mg total) by mouth at bedtime.  Dispense: 90 tablet; Refill: 3 - ipratropium-albuterol (DUONEB) 0.5-2.5 (3) MG/3ML SOLN; Take 3 mLs by nebulization 3 (three) times daily.  Dispense: 360 mL; Refill: 3  5. Gastroesophageal reflux disease without esophagitis Stable.   6. Anemia, iron deficiency Labs stable at nephrology.    7. Hyperlipemia Condition is stable. Please continue current medication and  plan of care as noted.   - simvastatin (ZOCOR) 20 MG tablet; Take 1 tablet (20 mg total) by mouth daily.  Dispense: 90 tablet; Refill: 3     Patient was seen and examined by Jerrell Belfast, MD, and note scribed by [name], CMA.  I have reviewed the document  for accuracy and completeness and I agree with above. - Jerrell Belfast, MD   Margarita Rana, MD  [facility]

## 2015-11-27 ENCOUNTER — Other Ambulatory Visit: Payer: Self-pay

## 2015-11-27 DIAGNOSIS — J449 Chronic obstructive pulmonary disease, unspecified: Secondary | ICD-10-CM

## 2016-01-21 ENCOUNTER — Telehealth: Payer: Self-pay

## 2016-01-21 DIAGNOSIS — J449 Chronic obstructive pulmonary disease, unspecified: Secondary | ICD-10-CM

## 2016-01-21 MED ORDER — IPRATROPIUM-ALBUTEROL 0.5-2.5 (3) MG/3ML IN SOLN: 3.0000 mL | Freq: Three times a day (TID) | RESPIRATORY_TRACT | Status: DC

## 2016-01-21 NOTE — Telephone Encounter (Signed)
Erik Gibbs was requesting 90 day prescription of pt's DuoNeb, which is 1080 mL per pharmacy. Sent in new prescription. Erik Gibbs, CMA

## 2016-01-23 ENCOUNTER — Other Ambulatory Visit: Payer: Self-pay | Admitting: Family Medicine

## 2016-01-23 DIAGNOSIS — J449 Chronic obstructive pulmonary disease, unspecified: Secondary | ICD-10-CM

## 2016-01-23 MED ORDER — IPRATROPIUM-ALBUTEROL 0.5-2.5 (3) MG/3ML IN SOLN: 3.0000 mL | RESPIRATORY_TRACT | Status: DC | PRN

## 2016-01-23 NOTE — Telephone Encounter (Signed)
Pt's wife walked in saying the RX was sent in wrong....  She said he (her husband)  Needs a new RX of  ipratropium-albuterol (DUONEB) 0.5-2.5 .    Pt 's wife said it should be 4 times a day,  3 months supply,  4 times a year  Pt's wife  wants to pick up the prescription today,  to have filled today....  Her call back [phone]  Con [place]

## 2016-01-23 NOTE — Telephone Encounter (Signed)
Rx reprinted and given to pt's wife.

## 2016-02-06 ENCOUNTER — Other Ambulatory Visit: Payer: Self-pay | Admitting: Family Medicine

## 2016-02-06 DIAGNOSIS — E1121 Type 2 diabetes mellitus with diabetic nephropathy: Secondary | ICD-10-CM

## 2016-02-06 DIAGNOSIS — Z794 Long term (current) use of insulin: Principal | ICD-10-CM

## 2016-02-13 ENCOUNTER — Other Ambulatory Visit: Payer: Self-pay | Admitting: Family Medicine

## 2016-02-13 DIAGNOSIS — J449 Chronic obstructive pulmonary disease, unspecified: Secondary | ICD-10-CM

## 2016-02-14 ENCOUNTER — Other Ambulatory Visit: Payer: Self-pay | Admitting: Family Medicine

## 2016-02-14 DIAGNOSIS — E1121 Type 2 diabetes mellitus with diabetic nephropathy: Secondary | ICD-10-CM

## 2016-02-14 DIAGNOSIS — Z794 Long term (current) use of insulin: Principal | ICD-10-CM

## 2016-02-19 ENCOUNTER — Ambulatory Visit: Payer: PPO | Admitting: Family Medicine

## 2016-02-26 ENCOUNTER — Encounter: Payer: Self-pay | Admitting: Family Medicine

## 2016-02-26 ENCOUNTER — Ambulatory Visit (INDEPENDENT_AMBULATORY_CARE_PROVIDER_SITE_OTHER): Payer: PPO | Admitting: Family Medicine

## 2016-02-26 VITALS — BP 128/64 | HR 84 | Temp 98.7°F | Resp 16 | Wt 210.0 lb

## 2016-02-26 DIAGNOSIS — E1121 Type 2 diabetes mellitus with diabetic nephropathy: Secondary | ICD-10-CM

## 2016-02-26 DIAGNOSIS — E785 Hyperlipidemia, unspecified: Secondary | ICD-10-CM | POA: Insufficient documentation

## 2016-02-26 DIAGNOSIS — Z794 Long term (current) use of insulin: Secondary | ICD-10-CM | POA: Diagnosis not present

## 2016-02-26 DIAGNOSIS — N183 Chronic kidney disease, stage 3 (moderate): Secondary | ICD-10-CM | POA: Diagnosis not present

## 2016-02-26 DIAGNOSIS — I129 Hypertensive chronic kidney disease with stage 1 through stage 4 chronic kidney disease, or unspecified chronic kidney disease: Secondary | ICD-10-CM

## 2016-02-26 DIAGNOSIS — J449 Chronic obstructive pulmonary disease, unspecified: Secondary | ICD-10-CM | POA: Diagnosis not present

## 2016-02-26 LAB — POCT GLYCOSYLATED HEMOGLOBIN (HGB A1C): HEMOGLOBIN A1C: 7.1

## 2016-02-26 NOTE — Progress Notes (Addendum)
Patient ID: Erik Gibbs, male   DOB: 25-Mar-1935, 81 y.o.   MRN: [id number]         Patient: Erik Gibbs Male    DOB: 1935/07/21   81 y.o.   MRN: [id number] Visit Date: 02/26/2016  Today's Provider: Margarita Rana, MD   Chief Complaint  Patient presents with  . Hypertension  . Hyperlipidemia  . Diabetes   Subjective:    HPI    Diabetes Mellitus Type II, Follow-up:   Lab Results  Component Value Date   HGBA1C 7.1 02/26/2016   HGBA1C 7.2 11/13/2015   HGBA1C 6.8 08/12/2015   Last seen for diabetes 3 months ago.  Management since then includes None. He reports excellent compliance with treatment. He is having side effects.  Current symptoms include none and have been stable. Home blood sugar records: 120-150's  Episodes of hypoglycemia? no   Most Recent Eye Exam: UTD Weight trend: stable Current diet: in general, a "healthy" diet   Current exercise: Walks everyday  ------------------------------------------------------------------------   Hypertension, follow-up:  BP Readings from Last 3 Encounters:  02/26/16 128/64  11/13/15 122/68  08/12/15 130/64    He was last seen for hypertension 3 months ago.  Management since that visit includes None .He reports excellent compliance with treatment. He is having side effects.  He is exercising. He is adherent to low salt diet.   He is experiencing none.  Patient denies chest pain, fatigue, lower extremity edema and palpitations.   Cardiovascular risk factors include advanced age (older than 35 for men, 32 for women), diabetes mellitus, hypertension, male gender and obesity (BMI >= 30 kg/m2).  ------------------------------------------------------------------------    Lipid/Cholesterol, Follow-up:   Last seen for this 3 months ago.  Management since that visit includes None.  Last Lipid Panel:    Component Value Date/Time   CHOL 123 06/13/2015 0806   TRIG 83 06/13/2015 0806   HDL 53 06/13/2015 0806   CHOLHDL 2.3 06/13/2015 0806   LDLCALC 53 06/13/2015 0806    He reports excellent compliance with treatment. He is not having side effects.   Wt Readings from Last 3 Encounters:  02/26/16 210 lb (95.255 kg)  11/13/15 209 lb (94.802 kg)  08/12/15 210 lb (95.255 kg)    ------------------------------------------------------------------------        Allergies  Allergen Reactions  . Amlodipine     BP drops severly  . Nitrofuran Derivatives   . Povidone-Iodine    Previous Medications   ALBUTEROL (PROVENTIL,VENTOLIN) 90 MCG/ACT INHALER    Inhale 2 puffs into the lungs every 6 (six) hours as needed for wheezing.   ASPIRIN 81 MG TABLET    Take 81 mg by mouth daily.     CILOSTAZOL (PLETAL) 100 MG TABLET    Take 1 tablet (100 mg total) by mouth 2 (two) times daily.   DOCUSATE SODIUM (COLACE) 50 MG CAPSULE    DOCUSATE SODIUM, 50MG  (Oral Capsule)  one every day for 0 days  Quantity: 0.00;  Refills: 0   Ordered :14-Jan-2011  Teena Dunk ;  Started 26-Jun-2009 Active   FAMOTIDINE (PEPCID) 20 MG TABLET    Take by mouth.   FREESTYLE LITE TEST STRIP    USE ONE STRIP TO CHECK GLUCOSE TWICE DAILY AND AS NEEDED   GABAPENTIN (NEURONTIN) 300 MG CAPSULE    Take 1 capsule (300 mg total) by mouth at bedtime.   INSULIN NPH-INSULIN REGULAR (NOVOLIN 70/30) (70-30) 100 UNIT/ML INJECTION    Inject into the skin as directed.  IPRATROPIUM-ALBUTEROL (DUONEB) 0.5-2.5 (3) MG/3ML SOLN    Take 3 mLs by nebulization every 4 (four) hours as needed.   IRON POLYSACCHARIDES (NIFEREX) 150 MG CAPSULE    Take 150 mg by mouth 3 (three) times daily.   LANCETS (FREESTYLE) LANCETS    To check blood sugars twice a day.  Dx: E11.9   LISINOPRIL (PRINIVIL,ZESTRIL) 10 MG TABLET    Take 1 tablet (10 mg total) by mouth daily.   LORATADINE (CLARITIN) 10 MG TABLET    Take 10 mg by mouth daily.     MONTELUKAST (SINGULAIR) 10 MG TABLET    Take 1 tablet (10 mg total) by mouth at bedtime.   NIACIN 500 MG CR CAPSULE    NIACIN,  500MG  (Oral Capsule Extended Release)  1 Every Day for 0 days  Quantity: 0.00;  Refills: 0   Ordered :08-Oct-2010  [name] ;  Started 28-Mar-2009 Active   OMEGA-3 FATTY ACIDS (FISH OIL) 1200 MG CAPS    Take by mouth daily.     PYRIDOXINE (VITAMIN B-6) 100 MG TABLET    Take 100 mg by mouth daily.     SIMVASTATIN (ZOCOR) 20 MG TABLET    Take 1 tablet (20 mg total) by mouth daily.   SYMBICORT 160-4.5 MCG/ACT INHALER    INHALE TWO PUFFS BY MOUTH TWICE DAILY    Review of Systems  Constitutional: Negative.   Respiratory: Positive for cough, shortness of breath and wheezing. Negative for apnea, choking, chest tightness and stridor.   Cardiovascular: Negative.   Gastrointestinal: Negative.   Musculoskeletal: Negative.   Neurological: Negative for dizziness, light-headedness and headaches.    Social History  Substance Use Topics  . Smoking status: Former Smoker -- 3.00 packs/day for 62 years    Types: Cigarettes    Quit date: 08/08/2010  . Smokeless tobacco: Never Used  . Alcohol Use: No     Comment: former heavy alcohol use   Objective:   BP 128/64 mmHg  Pulse 84  Temp(Src) 98.7 F (37.1 C) (Oral)  Resp 16  Wt 210 lb (95.255 kg)  Physical Exam  Constitutional: He is oriented to person, place, and time. He appears well-developed and well-nourished.  Cardiovascular: Normal rate, regular rhythm and normal heart sounds.   Pulmonary/Chest: Effort normal and breath sounds normal.  Neurological: He is alert and oriented to person, place, and time.  Psychiatric: He has a normal mood and affect. His behavior is normal. Judgment and thought content normal.   Results for orders placed or performed in visit on 02/26/16  POCT glycosylated hemoglobin (Hb A1C)  Result Value Ref Range   Hemoglobin A1C 7.1    Diabetic Foot Exam - Simple   Simple Foot Form  Diabetic Foot exam was performed with the following findings:  Yes   Visual Inspection  No deformities, no ulcerations, no other  skin breakdown bilaterally:  Yes  Sensation Testing  Intact to touch and monofilament testing bilaterally:  Yes  Pulse Check  Posterior Tibialis and Dorsalis pulse intact bilaterally:  Yes  Comments  Some pulse decrease but great perfusion of feet.           Assessment & Plan:     1. Benign hypertension with CKD (chronic kidney disease) stage III Stable; continue current medications.  Recheck in three months.   2. Type 2 diabetes mellitus with diabetic nephropathy, with long-term current use of insulin (HCC) A1C Stable at 7.1%; continue current medications.  Recheck in three months.  - POCT  glycosylated hemoglobin (Hb A1C) - Diabetes foot exam  3. Chronic obstructive pulmonary disease, unspecified COPD type ([place]) Stable. Continue current medication and plan of care. Lungs clear today.  Already had neb treatment this am.    4. Hyperlipemia Stable, will recheck labs in July.     Patient was seen and examined by Jerrell Belfast, MD, and note scribed by [name], CMA.  I have reviewed the document for accuracy and completeness and I agree with above. - Jerrell Belfast, MD   Margarita Rana, MD  [facility]

## 2016-05-07 DIAGNOSIS — D631 Anemia in chronic kidney disease: Secondary | ICD-10-CM | POA: Diagnosis not present

## 2016-05-07 DIAGNOSIS — E1122 Type 2 diabetes mellitus with diabetic chronic kidney disease: Secondary | ICD-10-CM | POA: Diagnosis not present

## 2016-05-07 DIAGNOSIS — I10 Essential (primary) hypertension: Secondary | ICD-10-CM | POA: Diagnosis not present

## 2016-05-07 DIAGNOSIS — N183 Chronic kidney disease, stage 3 (moderate): Secondary | ICD-10-CM | POA: Diagnosis not present

## 2016-05-27 ENCOUNTER — Encounter: Payer: Self-pay | Admitting: Physician Assistant

## 2016-05-27 ENCOUNTER — Ambulatory Visit (INDEPENDENT_AMBULATORY_CARE_PROVIDER_SITE_OTHER): Payer: PPO | Admitting: Physician Assistant

## 2016-05-27 VITALS — BP 130/70 | HR 67 | Temp 98.3°F | Resp 16

## 2016-05-27 DIAGNOSIS — N183 Chronic kidney disease, stage 3 unspecified: Secondary | ICD-10-CM

## 2016-05-27 DIAGNOSIS — I129 Hypertensive chronic kidney disease with stage 1 through stage 4 chronic kidney disease, or unspecified chronic kidney disease: Secondary | ICD-10-CM | POA: Diagnosis not present

## 2016-05-27 DIAGNOSIS — Z794 Long term (current) use of insulin: Secondary | ICD-10-CM

## 2016-05-27 DIAGNOSIS — E782 Mixed hyperlipidemia: Secondary | ICD-10-CM

## 2016-05-27 DIAGNOSIS — E1121 Type 2 diabetes mellitus with diabetic nephropathy: Secondary | ICD-10-CM

## 2016-05-27 DIAGNOSIS — D509 Iron deficiency anemia, unspecified: Secondary | ICD-10-CM | POA: Diagnosis not present

## 2016-05-27 LAB — POCT GLYCOSYLATED HEMOGLOBIN (HGB A1C)
ESTIMATED AVERAGE GLUCOSE: 160
Hemoglobin A1C: 7.2

## 2016-05-27 NOTE — Patient Instructions (Signed)

## 2016-05-27 NOTE — Progress Notes (Signed)
Patient: Erik Gibbs Male    DOB: [birth year]   81 y.o.   MRN: [id number] Visit Date: 05/27/2016  Today's Provider: Mar Daring, PA-C   Chief Complaint  Patient presents with  . Follow-up    HTN,DM,HDL   Subjective:    HPI  Diabetes Mellitus Type II, Follow-up:   Lab Results  Component Value Date   HGBA1C 7.1 02/26/2016   HGBA1C 7.2 11/13/2015   HGBA1C 6.8 08/12/2015   Last seen for diabetes 3 months ago.  Management since then includes none. He reports excellent compliance with treatment. He is not having side effects.  Current symptoms include none and have been stable. Home blood sugar records: fasting range: 120's-140's  Episodes of hypoglycemia? no   Most Recent Eye Exam: UTD and patient is scheduled for an eye exam the end of this month. Weight trend: stable Current diet: in general, a "healthy" diet   Current exercise: walks everyday  ------------------------------------------------------------------------   Hypertension, follow-up:  BP Readings from Last 3 Encounters:  05/27/16 130/70  02/26/16 128/64  11/13/15 122/68    He was last seen for hypertension 3 months ago.  Management since that visit includes none.He reports excellent compliance with treatment. He is not having side effects.  He is exercising. He is adherent to low salt diet.   He is experiencing none.  Patient denies chest pain, chest pressure/discomfort, fatigue, irregular heart beat, lower extremity edema and palpitations.  Cardiovascular risk factors include advanced age (older than 32 for men, 62 for women), diabetes mellitus, hypertension, male gender and obesity (BMI >= 30 kg/m2).   ------------------------------------------------------------------------    Lipid/Cholesterol, Follow-up:   Last seen for this 3 months ago.  Management since that visit includes none.  Last Lipid Panel:    Component Value Date/Time   CHOL 123 06/13/2015 0806   TRIG 83  06/13/2015 0806   HDL 53 06/13/2015 0806   CHOLHDL 2.3 06/13/2015 0806   LDLCALC 53 06/13/2015 0806    He reports excellent compliance with treatment. He is not having side effects.   Wt Readings from Last 3 Encounters:  02/26/16 210 lb (95.255 kg)  11/13/15 209 lb (94.802 kg)  08/12/15 210 lb (95.255 kg)    ------------------------------------------------------------------------      Allergies  Allergen Reactions  . Amlodipine     BP drops severly  . Nitrofuran Derivatives   . Povidone-Iodine    Current Meds  Medication Sig  . aspirin 81 MG tablet Take 81 mg by mouth daily.    . cilostazol (PLETAL) 100 MG tablet Take 1 tablet (100 mg total) by mouth 2 (two) times daily.  Marland Kitchen docusate sodium (COLACE) 50 MG capsule DOCUSATE SODIUM, 50MG  (Oral Capsule)  one every day for 0 days  Quantity: 0.00;  Refills: 0   Ordered :14-Jan-2011  Teena Dunk ;  Started 26-Jun-2009 Active  . famotidine (PEPCID) 20 MG tablet Take by mouth.  Marland Kitchen FREESTYLE LITE test strip USE ONE STRIP TO CHECK GLUCOSE TWICE DAILY AND AS NEEDED  . gabapentin (NEURONTIN) 300 MG capsule Take 1 capsule (300 mg total) by mouth at bedtime.  . insulin NPH-insulin regular (NOVOLIN 70/30) (70-30) 100 UNIT/ML injection Inject into the skin as directed.    Marland Kitchen ipratropium-albuterol (DUONEB) 0.5-2.5 (3) MG/3ML SOLN Take 3 mLs by nebulization every 4 (four) hours as needed.  . iron polysaccharides (NIFEREX) 150 MG capsule Take 150 mg by mouth 3 (three) times daily.  . Lancets (FREESTYLE) lancets To  check blood sugars twice a day.  Dx: E11.9  . lisinopril (PRINIVIL,ZESTRIL) 10 MG tablet Take 1 tablet (10 mg total) by mouth daily.  Marland Kitchen loratadine (CLARITIN) 10 MG tablet Take 10 mg by mouth daily.    . montelukast (SINGULAIR) 10 MG tablet Take 1 tablet (10 mg total) by mouth at bedtime.  . niacin 500 MG CR capsule NIACIN, 500MG  (Oral Capsule Extended Release)  1 Every Day for 0 days  Quantity: 0.00;  Refills: 0   Ordered  :08-Oct-2010  [name] ;  Started 28-Mar-2009 Active  . Omega-3 Fatty Acids (FISH OIL) 1200 MG CAPS Take by mouth daily.    Marland Kitchen pyridOXINE (VITAMIN B-6) 100 MG tablet Take 100 mg by mouth daily.    . simvastatin (ZOCOR) 20 MG tablet Take 1 tablet (20 mg total) by mouth daily.  . SYMBICORT 160-4.5 MCG/ACT inhaler INHALE TWO PUFFS BY MOUTH TWICE DAILY    Review of Systems  Constitutional: Negative.   HENT: Negative.   Respiratory: Positive for cough, chest tightness and shortness of breath.        All stable  Cardiovascular: Negative.   Gastrointestinal: Negative.   Genitourinary: Positive for enuresis.  Musculoskeletal: Negative.   Neurological: Negative.     Social History  Substance Use Topics  . Smoking status: Former Smoker -- 3.00 packs/day for 62 years    Types: Cigarettes    Quit date: 08/08/2010  . Smokeless tobacco: Never Used  . Alcohol Use: No     Comment: former heavy alcohol use   Objective:   BP 130/70 mmHg  Pulse 67  Temp(Src) 98.3 F (36.8 C) (Oral)  Resp 16  SpO2 95%  Physical Exam  Constitutional: He appears well-developed and well-nourished. No distress.  HENT:  Head: Normocephalic and atraumatic.  Neck: Normal range of motion. Neck supple. No JVD present. No tracheal deviation present. No thyromegaly present.  Cardiovascular: Normal rate, regular rhythm and normal heart sounds.  Exam reveals no gallop and no friction rub.   No murmur heard. Pulmonary/Chest: Effort normal. No respiratory distress. He has wheezes. He has no rales. He exhibits no tenderness.  Lymphadenopathy:    He has no cervical adenopathy.  Skin: He is not diaphoretic.  Vitals reviewed.     Assessment & Plan:     1. Benign hypertension with CKD (chronic kidney disease) stage III Stable. Continue Lisinopril and pletal as directed. Will check labs and f/u pending results. Will recheck in 3 months. - CBC with Differential - Comprehensive Metabolic Panel (CMET) - TSH  2.  Type 2 diabetes mellitus with diabetic nephropathy, with long-term current use of insulin (HCC) Stable at 7.2. Continue current insulin regimen. Will f/u pending other results and recheck in 3 months. - POCT glycosylated hemoglobin (Hb A1C) - Comprehensive Metabolic Panel (CMET)  3. Chronic kidney disease (CKD), stage III (moderate) Stable. Followed by Dr. Jacqlyn Larsen. Will check labs as below and f/u pending lab results. Will forward results to Dr. Candiss Norse and Dr. Jacqlyn Larsen once received. - Comprehensive Metabolic Panel (CMET)  4. Anemia, iron deficiency Previously stable. Continue current medical treatment plan. Will f/u pending lab results and will forward to Dr. Candiss Norse and Dr. Jacqlyn Larsen. - Iron and TIBC - Ferritin - B12  5. Combined fat and carbohydrate induced hyperlipemia [place]. Continue simvastatin. Will f/u pending lab results. - Lipid Profile  The entirety of the information documented in the History of Present Illness, Review of Systems and Physical Exam were personally obtained by me. Portions of  this information were initially documented by Lyndel Pleasure, CMA and reviewed by me for thoroughness and accuracy.      Mar Daring, PA-C  [facility]

## 2016-05-28 DIAGNOSIS — N183 Chronic kidney disease, stage 3 (moderate): Secondary | ICD-10-CM | POA: Diagnosis not present

## 2016-05-28 DIAGNOSIS — Z794 Long term (current) use of insulin: Secondary | ICD-10-CM | POA: Diagnosis not present

## 2016-05-28 DIAGNOSIS — E1121 Type 2 diabetes mellitus with diabetic nephropathy: Secondary | ICD-10-CM | POA: Diagnosis not present

## 2016-05-28 DIAGNOSIS — E782 Mixed hyperlipidemia: Secondary | ICD-10-CM | POA: Diagnosis not present

## 2016-05-28 DIAGNOSIS — I129 Hypertensive chronic kidney disease with stage 1 through stage 4 chronic kidney disease, or unspecified chronic kidney disease: Secondary | ICD-10-CM | POA: Diagnosis not present

## 2016-05-28 DIAGNOSIS — D509 Iron deficiency anemia, unspecified: Secondary | ICD-10-CM | POA: Diagnosis not present

## 2016-05-29 ENCOUNTER — Telehealth: Payer: Self-pay

## 2016-05-29 LAB — CBC WITH DIFFERENTIAL/PLATELET
BASOS: 0 %
Basophils Absolute: 0 10*3/uL (ref 0.0–0.2)
EOS (ABSOLUTE): 0.5 10*3/uL — ABNORMAL HIGH (ref 0.0–0.4)
EOS: 7 %
HEMATOCRIT: 30.5 % — AB (ref 37.5–51.0)
HEMOGLOBIN: 10.1 g/dL — AB (ref 12.6–17.7)
Immature Grans (Abs): 0 10*3/uL (ref 0.0–0.1)
Immature Granulocytes: 1 %
LYMPHS ABS: 1.5 10*3/uL (ref 0.7–3.1)
Lymphs: 24 %
MCH: 31.3 pg (ref 26.6–33.0)
MCHC: 33.1 g/dL (ref 31.5–35.7)
MCV: 94 fL (ref 79–97)
MONOCYTES: 7 %
Monocytes Absolute: 0.4 10*3/uL (ref 0.1–0.9)
NEUTROS ABS: 3.9 10*3/uL (ref 1.4–7.0)
Neutrophils: 61 %
Platelets: 221 10*3/uL (ref 150–379)
RBC: 3.23 x10E6/uL — AB (ref 4.14–5.80)
RDW: 13.1 % (ref 12.3–15.4)
WBC: 6.3 10*3/uL (ref 3.4–10.8)

## 2016-05-29 LAB — IRON AND TIBC
IRON SATURATION: 14 % — AB (ref 15–55)
IRON: 42 ug/dL (ref 38–169)
Total Iron Binding Capacity: 300 ug/dL (ref 250–450)
UIBC: 258 ug/dL (ref 111–343)

## 2016-05-29 LAB — TSH: TSH: 1.82 u[IU]/mL (ref 0.450–4.500)

## 2016-05-29 LAB — COMPREHENSIVE METABOLIC PANEL
ALBUMIN: 4.1 g/dL (ref 3.5–4.8)
ALK PHOS: 48 IU/L (ref 39–117)
ALT: 17 IU/L (ref 0–44)
AST: 18 IU/L (ref 0–40)
Albumin/Globulin Ratio: 1.4 (ref 1.2–2.2)
BUN / CREAT RATIO: 14 (ref 10–24)
BUN: 25 mg/dL (ref 8–27)
Bilirubin Total: <0.2 mg/dL (ref 0.0–1.2)
CO2: 23 mmol/L (ref 18–29)
CREATININE: 1.8 mg/dL — AB (ref 0.76–1.27)
Calcium: 9.4 mg/dL (ref 8.6–10.2)
Chloride: 103 mmol/L (ref 96–106)
GFR calc Af Amer: 41 mL/min/{1.73_m2} — ABNORMAL LOW (ref >59)
GFR calc non Af Amer: 35 mL/min/{1.73_m2} — ABNORMAL LOW (ref >59)
GLOBULIN, TOTAL: 3 g/dL (ref 1.5–4.5)
Glucose: 148 mg/dL — ABNORMAL HIGH (ref 65–99)
Potassium: 4.9 mmol/L (ref 3.5–5.2)
SODIUM: 143 mmol/L (ref 134–144)
Total Protein: 7.1 g/dL (ref 6.0–8.5)

## 2016-05-29 LAB — LIPID PANEL
CHOLESTEROL TOTAL: 115 mg/dL (ref 100–199)
Chol/HDL Ratio: 2.2 ratio units (ref 0.0–5.0)
HDL: 52 mg/dL (ref >39)
LDL Calculated: 45 mg/dL (ref 0–99)
TRIGLYCERIDES: 91 mg/dL (ref 0–149)
VLDL Cholesterol Cal: 18 mg/dL (ref 5–40)

## 2016-05-29 LAB — FERRITIN: FERRITIN: 29 ng/mL — AB (ref 30–400)

## 2016-05-29 LAB — VITAMIN B12: Vitamin B-12: 978 pg/mL — ABNORMAL HIGH (ref 211–946)

## 2016-05-29 NOTE — Telephone Encounter (Signed)
Patient advised as below.  

## 2016-05-29 NOTE — Telephone Encounter (Signed)
-----   Message from Mar Daring, PA-C sent at 05/29/2016 10:15 AM EDT ----- All labs are within normal limits and stable.  Iron is still slightly low.  Will send to Dr. Jacqlyn Larsen and Dr. Candiss Norse. Thanks! -JB

## 2016-06-18 DIAGNOSIS — E119 Type 2 diabetes mellitus without complications: Secondary | ICD-10-CM | POA: Diagnosis not present

## 2016-06-21 ENCOUNTER — Other Ambulatory Visit: Payer: Self-pay | Admitting: Family Medicine

## 2016-06-21 DIAGNOSIS — K219 Gastro-esophageal reflux disease without esophagitis: Secondary | ICD-10-CM

## 2016-06-26 ENCOUNTER — Encounter: Payer: Self-pay | Admitting: Physician Assistant

## 2016-08-27 ENCOUNTER — Ambulatory Visit (INDEPENDENT_AMBULATORY_CARE_PROVIDER_SITE_OTHER): Payer: PPO | Admitting: Physician Assistant

## 2016-08-27 ENCOUNTER — Encounter: Payer: Self-pay | Admitting: Physician Assistant

## 2016-08-27 VITALS — BP 132/64 | HR 90 | Temp 98.0°F | Resp 16 | Wt 210.4 lb

## 2016-08-27 DIAGNOSIS — E1121 Type 2 diabetes mellitus with diabetic nephropathy: Secondary | ICD-10-CM | POA: Diagnosis not present

## 2016-08-27 DIAGNOSIS — I129 Hypertensive chronic kidney disease with stage 1 through stage 4 chronic kidney disease, or unspecified chronic kidney disease: Secondary | ICD-10-CM

## 2016-08-27 DIAGNOSIS — Z794 Long term (current) use of insulin: Secondary | ICD-10-CM | POA: Diagnosis not present

## 2016-08-27 DIAGNOSIS — N183 Chronic kidney disease, stage 3 (moderate): Secondary | ICD-10-CM | POA: Diagnosis not present

## 2016-08-27 LAB — POCT GLYCOSYLATED HEMOGLOBIN (HGB A1C)
Est. average glucose Bld gHb Est-mCnc: 151
Hemoglobin A1C: 6.9

## 2016-08-27 NOTE — Progress Notes (Signed)
Patient: Erik Gibbs Male    DOB: 10/09/1935   81 y.o.   MRN: [id number] Visit Date: 08/27/2016  Today's Provider: Mar Daring, PA-C   Chief Complaint  Patient presents with  . Follow-up    Hypertension,Diabetes, Hyperlipidemia   Subjective:    HPI  Hypertension, follow-up:  BP Readings from Last 3 Encounters:  08/27/16 130/64  05/27/16 130/70  02/26/16 128/64    He was last seen for hypertension 3 months ago.  BP at that visit was 130/70. Management since that visit includes none. He reports excellent compliance with treatment. He is not having side effects. He is exercising. He is adherent to low salt diet.   He is experiencing none.  Patient denies chest pain, chest pressure/discomfort, fatigue, irregular heart beat, lower extremity edema, near-syncope and palpitations.   Cardiovascular risk factors include advanced age (older than 54 for men, 76 for women), diabetes mellitus, dyslipidemia, hypertension and obesity (BMI >= 30 kg/m2).   BP rechecked and R arm was 130/64, L arm was 132/64.  Weight trend: stable Wt Readings from Last 3 Encounters:  08/27/16 210 lb 6.4 oz (95.4 kg)  02/26/16 210 lb (95.3 kg)  11/13/15 209 lb (94.8 kg)    Current diet: in general, a "healthy" diet    ------------------------------------------------------------------------  Lipid/Cholesterol, Follow-up:   Last seen for this3 months ago.  Management changes since that visit include none. . Last Lipid Panel:    Component Value Date/Time   CHOL 115 05/28/2016 0803   TRIG 91 05/28/2016 0803   HDL 52 05/28/2016 0803   CHOLHDL 2.2 05/28/2016 0803   LDLCALC 45 05/28/2016 0803     He reports excellent compliance with treatment. He is not having side effects.  Current symptoms include none and have been stable. Weight trend: stable Current diet: in general, a "healthy" diet   Current exercise: walking  Wt Readings from Last 3 Encounters:  08/27/16 210 lb  6.4 oz (95.4 kg)  02/26/16 210 lb (95.3 kg)  11/13/15 209 lb (94.8 kg)    -------------------------------------------------------------------  Diabetes Mellitus Type II, Follow-up:   Lab Results  Component Value Date   HGBA1C 6.9 08/27/2016   HGBA1C 7.2 05/27/2016   HGBA1C 7.1 02/26/2016    Last seen for diabetes 3 months ago.  Management since then includes none. He reports excellent compliance with treatment. He is not having side effects.  Current symptoms include none and have been stable. Home blood sugar records: 120's-100's AM Most Recent Eye Exam: UTD Weight trend: stable Current diet: in general, a "healthy" diet   Current exercise: walking  Pertinent Labs:    Component Value Date/Time   CHOL 115 05/28/2016 0803   TRIG 91 05/28/2016 0803   HDL 52 05/28/2016 0803   LDLCALC 45 05/28/2016 0803   CREATININE 1.80 (H) 05/28/2016 0803    Wt Readings from Last 3 Encounters:  08/27/16 210 lb 6.4 oz (95.4 kg)  02/26/16 210 lb (95.3 kg)  11/13/15 209 lb (94.8 kg)    ------------------------------------------------------------------------     Allergies  Allergen Reactions  . Amlodipine     BP drops severly  . Nitrofuran Derivatives   . Povidone-Iodine      Current Outpatient Prescriptions:  .  aspirin 81 MG tablet, Take 81 mg by mouth daily.  , Disp: , Rfl:  .  cilostazol (PLETAL) 100 MG tablet, Take 1 tablet (100 mg total) by mouth 2 (two) times daily., Disp: 180 tablet, Rfl:  3 .  docusate sodium (COLACE) 50 MG capsule, DOCUSATE SODIUM, 50MG  (Oral Capsule)  one every day for 0 days  Quantity: 0.00;  Refills: 0   Ordered :14-Jan-2011  Teena Dunk ;  Started 26-Jun-2009 Active, Disp: , Rfl:  .  famotidine (PEPCID) 20 MG tablet, TAKE ONE TABLET BY MOUTH TWICE DAILY, Disp: 180 tablet, Rfl: 1 .  FREESTYLE LITE test strip, USE ONE STRIP TO CHECK GLUCOSE TWICE DAILY AND AS NEEDED, Disp: 100 each, Rfl: 3 .  gabapentin (NEURONTIN) 300 MG capsule, Take 1  capsule (300 mg total) by mouth at bedtime., Disp: 90 capsule, Rfl: 3 .  insulin NPH-insulin regular (NOVOLIN 70/30) (70-30) 100 UNIT/ML injection, Inject into the skin as directed.  , Disp: , Rfl:  .  ipratropium-albuterol (DUONEB) 0.5-2.5 (3) MG/3ML SOLN, Take 3 mLs by nebulization every 4 (four) hours as needed., Disp: 1080 mL, Rfl: 3 .  iron polysaccharides (NIFEREX) 150 MG capsule, Take 150 mg by mouth 3 (three) times daily., Disp: , Rfl:  .  Lancets (FREESTYLE) lancets, To check blood sugars twice a day.  Dx: E11.9, Disp: 200 each, Rfl: 1 .  lisinopril (PRINIVIL,ZESTRIL) 10 MG tablet, Take 1 tablet (10 mg total) by mouth daily., Disp: 90 tablet, Rfl: 3 .  loratadine (CLARITIN) 10 MG tablet, Take 10 mg by mouth daily.  , Disp: , Rfl:  .  montelukast (SINGULAIR) 10 MG tablet, Take 1 tablet (10 mg total) by mouth at bedtime., Disp: 90 tablet, Rfl: 3 .  niacin 500 MG CR capsule, NIACIN, 500MG  (Oral Capsule Extended Release)  1 Every Day for 0 days  Quantity: 0.00;  Refills: 0   Ordered :08-Oct-2010  [name] ;  Started 28-Mar-2009 Active, Disp: , Rfl:  .  Omega-3 Fatty Acids (FISH OIL) 1200 MG CAPS, Take by mouth daily.  , Disp: , Rfl:  .  pyridOXINE (VITAMIN B-6) 100 MG tablet, Take 100 mg by mouth daily.  , Disp: , Rfl:  .  simvastatin (ZOCOR) 20 MG tablet, Take 1 tablet (20 mg total) by mouth daily., Disp: 90 tablet, Rfl: 3 .  SYMBICORT 160-4.5 MCG/ACT inhaler, INHALE TWO PUFFS BY MOUTH TWICE DAILY, Disp: 3 Inhaler, Rfl: 1 .  albuterol (PROVENTIL,VENTOLIN) 90 MCG/ACT inhaler, Inhale 2 puffs into the lungs every 6 (six) hours as needed for wheezing., Disp: 17 g, Rfl: 6  Review of Systems  Constitutional: Negative for fatigue.  Respiratory: Positive for cough, shortness of breath and wheezing. Negative for chest tightness.   Cardiovascular: Negative for chest pain, palpitations and leg swelling.  Gastrointestinal: Negative.   Neurological: Negative for dizziness, light-headedness and  headaches.    Social History  Substance Use Topics  . Smoking status: Former Smoker    Packs/day: 3.00    Years: 62.00    Types: Cigarettes    Quit date: 08/08/2010  . Smokeless tobacco: Never Used  . Alcohol use No     Comment: former heavy alcohol use   Objective:   BP 130/64 (BP Location: Right Arm, Patient Position: Sitting, Cuff Size: Large)   Pulse 90   Temp 98 F (36.7 C) (Oral)   Resp 16   Wt 210 lb 6.4 oz (95.4 kg)   SpO2 99%   BMI 31.99 kg/m   Physical Exam  Constitutional: He appears well-developed and well-nourished. No distress.  HENT:  Head: Normocephalic and atraumatic.  Neck: Normal range of motion. Neck supple. No JVD present. No tracheal deviation present. No thyromegaly present.  Cardiovascular: Normal rate,  regular rhythm and normal heart sounds.  Exam reveals no gallop and no friction rub.   No murmur heard. Pulmonary/Chest: Effort normal. No respiratory distress. He has wheezes. He has no rales. He exhibits no tenderness.  Lymphadenopathy:    He has no cervical adenopathy.  Skin: He is not diaphoretic.  Vitals reviewed.     Assessment & Plan:     1. Benign hypertension with CKD (chronic kidney disease) stage III Stable. Continue current medical treatment plan. Will recheck in 3 months.  2. Type 2 diabetes mellitus with diabetic nephropathy, with long-term current use of insulin (HCC) Improved to 6.9 from 7.2. Continue medical treatment plan. Will recheck in 3 months. - POCT glycosylated hemoglobin (Hb A1C)       Mar Daring, PA-C  [facility]

## 2016-08-27 NOTE — Patient Instructions (Signed)
DASH Eating Plan  DASH stands for "Dietary Approaches to Stop Hypertension." The DASH eating plan is a healthy eating plan that has been shown to reduce high blood pressure (hypertension). Additional health benefits may include reducing the risk of type 2 diabetes mellitus, heart disease, and stroke. The DASH eating plan may also help with weight loss.  WHAT DO I NEED TO KNOW ABOUT THE DASH EATING PLAN?  For the DASH eating plan, you will follow these general guidelines:  · Choose foods with a percent daily value for sodium of less than 5% (as listed on the food label).  · Use salt-free seasonings or herbs instead of table salt or sea salt.  · Check with your health care provider or pharmacist before using salt substitutes.  · Eat lower-sodium products, often labeled as "lower sodium" or "no salt added."  · Eat fresh foods.  · Eat more vegetables, fruits, and low-fat dairy products.  · Choose whole grains. Look for the word "whole" as the first word in the ingredient list.  · Choose fish and skinless chicken or turkey more often than red meat. Limit fish, poultry, and meat to 6 oz (170 g) each day.  · Limit sweets, desserts, sugars, and sugary drinks.  · Choose heart-healthy fats.  · Limit cheese to 1 oz (28 g) per day.  · Eat more home-cooked food and less restaurant, buffet, and fast food.  · Limit fried foods.  · Cook foods using methods other than frying.  · Limit canned vegetables. If you do use them, rinse them well to decrease the sodium.  · When eating at a restaurant, ask that your food be prepared with less salt, or no salt if possible.  WHAT FOODS CAN I EAT?  Seek help from a dietitian for individual calorie needs.  Grains  Whole grain or whole wheat bread. Brown rice. Whole grain or whole wheat pasta. Quinoa, bulgur, and whole grain cereals. Low-sodium cereals. Corn or whole wheat flour tortillas. Whole grain cornbread. Whole grain crackers. Low-sodium crackers.  Vegetables  Fresh or frozen vegetables  (raw, steamed, roasted, or grilled). Low-sodium or reduced-sodium tomato and vegetable juices. Low-sodium or reduced-sodium tomato sauce and paste. Low-sodium or reduced-sodium canned vegetables.   Fruits  All fresh, canned (in natural juice), or frozen fruits.  Meat and Other Protein Products  Ground beef (85% or leaner), grass-fed beef, or beef trimmed of fat. Skinless chicken or turkey. Ground chicken or turkey. Pork trimmed of fat. All fish and seafood. Eggs. Dried beans, peas, or lentils. Unsalted nuts and seeds. Unsalted canned beans.  Dairy  Low-fat dairy products, such as skim or 1% milk, 2% or reduced-fat cheeses, low-fat ricotta or cottage cheese, or plain low-fat yogurt. Low-sodium or reduced-sodium cheeses.  Fats and Oils  Tub margarines without trans fats. Light or reduced-fat mayonnaise and salad dressings (reduced sodium). Avocado. Safflower, olive, or canola oils. Natural peanut or almond butter.  Other  Unsalted popcorn and pretzels.  The items listed above may not be a complete list of recommended foods or beverages. Contact your dietitian for more options.  WHAT FOODS ARE NOT RECOMMENDED?  Grains  White bread. White pasta. White rice. Refined cornbread. Bagels and croissants. Crackers that contain trans fat.  Vegetables  Creamed or fried vegetables. Vegetables in a cheese sauce. Regular canned vegetables. Regular canned tomato sauce and paste. Regular tomato and vegetable juices.  Fruits  Dried fruits. Canned fruit in light or heavy syrup. Fruit juice.  Meat and Other Protein   Products  Fatty cuts of meat. Ribs, chicken wings, bacon, sausage, bologna, salami, chitterlings, fatback, hot dogs, bratwurst, and packaged luncheon meats. Salted nuts and seeds. Canned beans with salt.  Dairy  Whole or 2% milk, cream, half-and-half, and cream cheese. Whole-fat or sweetened yogurt. Full-fat cheeses or blue cheese. Nondairy creamers and whipped toppings. Processed cheese, cheese spreads, or cheese  curds.  Condiments  Onion and garlic salt, seasoned salt, table salt, and sea salt. Canned and packaged gravies. Worcestershire sauce. Tartar sauce. Barbecue sauce. Teriyaki sauce. Soy sauce, including reduced sodium. Steak sauce. Fish sauce. Oyster sauce. Cocktail sauce. Horseradish. Ketchup and mustard. Meat flavorings and tenderizers. Bouillon cubes. Hot sauce. Tabasco sauce. Marinades. Taco seasonings. Relishes.  Fats and Oils  Butter, stick margarine, lard, shortening, ghee, and bacon fat. Coconut, palm kernel, or palm oils. Regular salad dressings.  Other  Pickles and olives. Salted popcorn and pretzels.  The items listed above may not be a complete list of foods and beverages to avoid. Contact your dietitian for more information.  WHERE CAN I FIND MORE INFORMATION?  National [employer], [employer], [employer]: www.nhlbi.nih.gov/health/health-topics/topics/dash/     This information is not intended to replace advice given to you by your health care provider. Make sure you discuss any questions you have with your health care provider.     Document Released: 10/29/2011 Document Revised: 11/30/2014 Document Reviewed: 09/13/2013  Elsevier Interactive Patient Education ©2016 [employer] Inc[employer]

## 2016-08-31 ENCOUNTER — Other Ambulatory Visit: Payer: Self-pay | Admitting: Family Medicine

## 2016-08-31 DIAGNOSIS — E1121 Type 2 diabetes mellitus with diabetic nephropathy: Secondary | ICD-10-CM

## 2016-08-31 DIAGNOSIS — Z794 Long term (current) use of insulin: Principal | ICD-10-CM

## 2016-10-08 DIAGNOSIS — N329 Bladder disorder, unspecified: Secondary | ICD-10-CM | POA: Diagnosis not present

## 2016-10-08 DIAGNOSIS — C61 Malignant neoplasm of prostate: Secondary | ICD-10-CM | POA: Diagnosis not present

## 2016-10-08 DIAGNOSIS — Z8551 Personal history of malignant neoplasm of bladder: Secondary | ICD-10-CM | POA: Diagnosis not present

## 2016-10-08 DIAGNOSIS — D414 Neoplasm of uncertain behavior of bladder: Secondary | ICD-10-CM | POA: Diagnosis not present

## 2016-11-01 ENCOUNTER — Other Ambulatory Visit: Payer: Self-pay | Admitting: Family Medicine

## 2016-11-01 DIAGNOSIS — Z794 Long term (current) use of insulin: Principal | ICD-10-CM

## 2016-11-01 DIAGNOSIS — E1121 Type 2 diabetes mellitus with diabetic nephropathy: Secondary | ICD-10-CM

## 2016-11-11 DIAGNOSIS — N183 Chronic kidney disease, stage 3 (moderate): Secondary | ICD-10-CM | POA: Diagnosis not present

## 2016-11-11 DIAGNOSIS — E1122 Type 2 diabetes mellitus with diabetic chronic kidney disease: Secondary | ICD-10-CM | POA: Diagnosis not present

## 2016-11-11 DIAGNOSIS — I10 Essential (primary) hypertension: Secondary | ICD-10-CM | POA: Diagnosis not present

## 2016-11-17 ENCOUNTER — Other Ambulatory Visit: Payer: Self-pay | Admitting: Family Medicine

## 2016-11-17 DIAGNOSIS — I129 Hypertensive chronic kidney disease with stage 1 through stage 4 chronic kidney disease, or unspecified chronic kidney disease: Secondary | ICD-10-CM

## 2016-11-17 DIAGNOSIS — N183 Chronic kidney disease, stage 3 unspecified: Secondary | ICD-10-CM

## 2016-11-17 DIAGNOSIS — J449 Chronic obstructive pulmonary disease, unspecified: Secondary | ICD-10-CM

## 2016-11-17 NOTE — Telephone Encounter (Signed)
Please review-aa 

## 2016-12-02 ENCOUNTER — Ambulatory Visit (INDEPENDENT_AMBULATORY_CARE_PROVIDER_SITE_OTHER): Payer: PPO | Admitting: Physician Assistant

## 2016-12-02 ENCOUNTER — Encounter: Payer: Self-pay | Admitting: Physician Assistant

## 2016-12-02 DIAGNOSIS — E7800 Pure hypercholesterolemia, unspecified: Secondary | ICD-10-CM | POA: Diagnosis not present

## 2016-12-02 DIAGNOSIS — D631 Anemia in chronic kidney disease: Secondary | ICD-10-CM | POA: Diagnosis not present

## 2016-12-02 DIAGNOSIS — K219 Gastro-esophageal reflux disease without esophagitis: Secondary | ICD-10-CM

## 2016-12-02 DIAGNOSIS — J449 Chronic obstructive pulmonary disease, unspecified: Secondary | ICD-10-CM | POA: Diagnosis not present

## 2016-12-02 DIAGNOSIS — N183 Chronic kidney disease, stage 3 unspecified: Secondary | ICD-10-CM

## 2016-12-02 DIAGNOSIS — Z794 Long term (current) use of insulin: Secondary | ICD-10-CM

## 2016-12-02 DIAGNOSIS — G629 Polyneuropathy, unspecified: Secondary | ICD-10-CM

## 2016-12-02 DIAGNOSIS — E1121 Type 2 diabetes mellitus with diabetic nephropathy: Secondary | ICD-10-CM | POA: Diagnosis not present

## 2016-12-02 MED ORDER — CILOSTAZOL 100 MG PO TABS: 100.0000 mg | ORAL_TABLET | Freq: Two times a day (BID) | ORAL | 3 refills | Status: DC

## 2016-12-02 MED ORDER — BUDESONIDE-FORMOTEROL FUMARATE 160-4.5 MCG/ACT IN AERO: 2.0000 | INHALATION_SPRAY | Freq: Two times a day (BID) | RESPIRATORY_TRACT | 3 refills | Status: DC

## 2016-12-02 MED ORDER — GABAPENTIN 300 MG PO CAPS: 300.0000 mg | ORAL_CAPSULE | Freq: Every day | ORAL | 3 refills | Status: DC

## 2016-12-02 MED ORDER — FAMOTIDINE 20 MG PO TABS: 20.0000 mg | ORAL_TABLET | Freq: Two times a day (BID) | ORAL | 3 refills | Status: DC

## 2016-12-02 MED ORDER — IPRATROPIUM-ALBUTEROL 0.5-2.5 (3) MG/3ML IN SOLN: 3.0000 mL | RESPIRATORY_TRACT | 3 refills | Status: DC | PRN

## 2016-12-02 MED ORDER — SIMVASTATIN 20 MG PO TABS
20.0000 mg | ORAL_TABLET | Freq: Every day | ORAL | 3 refills | Status: DC
Start: 2016-12-02 — End: 2017-12-20

## 2016-12-02 NOTE — Patient Instructions (Signed)
Anemia, Nonspecific Anemia is a condition in which the concentration of red blood cells or hemoglobin in the blood is below normal. Hemoglobin is a substance in red blood cells that carries oxygen to the tissues of the body. Anemia results in not enough oxygen reaching these tissues. What are the causes? Common causes of anemia include:  Excessive bleeding. Bleeding may be internal or external. This includes excessive bleeding from periods (in women) or from the intestine.  Poor nutrition.  Chronic kidney, thyroid, and liver disease.  Bone marrow disorders that decrease red blood cell production.  Cancer and treatments for cancer.  HIV, AIDS, and their treatments.  Spleen problems that increase red blood cell destruction.  Blood disorders.  Excess destruction of red blood cells due to infection, medicines, and autoimmune disorders. What are the signs or symptoms?  Minor weakness.  Dizziness.  Headache.  Palpitations.  Shortness of breath, especially with exercise.  Paleness.  Cold sensitivity.  Indigestion.  Nausea.  Difficulty sleeping.  Difficulty concentrating. Symptoms may occur suddenly or they may develop slowly. How is this diagnosed? Additional blood tests are often needed. These help your health care provider determine the best treatment. Your health care provider will check your stool for blood and look for other causes of blood loss. How is this treated? Treatment varies depending on the cause of the anemia. Treatment can include:  Supplements of iron, vitamin B12, or folic acid.  Hormone medicines.  A blood transfusion. This may be needed if blood loss is severe.  Hospitalization. This may be needed if there is significant continual blood loss.  Dietary changes.  Spleen removal. Follow these instructions at home: Keep all follow-up appointments. It often takes many weeks to correct anemia, and having your health care provider check on your  condition and your response to treatment is very important. Get help right away if:  You develop extreme weakness, shortness of breath, or chest pain.  You become dizzy or have trouble concentrating.  You develop heavy vaginal bleeding.  You develop a rash.  You have bloody or black, tarry stools.  You faint.  You vomit up blood.  You vomit repeatedly.  You have abdominal pain.  You have a fever or persistent symptoms for more than 2-3 days.  You have a fever and your symptoms suddenly get worse.  You are dehydrated. This information is not intended to replace advice given to you by your health care provider. Make sure you discuss any questions you have with your health care provider. Document Released: 12/17/2004 Document Revised: 04/22/2016 Document Reviewed: 05/05/2013 Elsevier Interactive Patient Education  2017 Elsevier Inc.  

## 2016-12-02 NOTE — Progress Notes (Signed)
Patient: Erik Gibbs Male    DOB: 1935-02-10   82 y.o.   MRN: [id number] Visit Date: 12/02/2016  Today's Provider: Mar Daring, PA-C   Chief Complaint  Patient presents with  . Diabetes  . Hypertension   Subjective:    HPI  Diabetes Mellitus Type II, Follow-up:   Lab Results  Component Value Date   HGBA1C 6.9 08/27/2016   HGBA1C 7.2 05/27/2016   HGBA1C 7.1 02/26/2016    Last seen for diabetes 3 months ago.  Management since then includes no changes. He reports excellent compliance with treatment. He is not having side effects.  Current symptoms include none and have been stable. Home blood sugar records: fasting range: 125-130's  Episodes of hypoglycemia? no   Current Insulin Regimen: as prescribed Most Recent Eye Exam: UTD Weight trend: stable Prior visit with dietician: no Current diet: in general, a "healthy" diet   Current exercise: none  Pertinent Labs:    Component Value Date/Time   CHOL 115 05/28/2016 0803   TRIG 91 05/28/2016 0803   HDL 52 05/28/2016 0803   LDLCALC 45 05/28/2016 0803   CREATININE 1.80 (H) 05/28/2016 0803    Wt Readings from Last 3 Encounters:  08/27/16 210 lb 6.4 oz (95.4 kg)  02/26/16 210 lb (95.3 kg)  11/13/15 209 lb (94.8 kg)    ------------------------------------------------------------------------   Hypertension, follow-up:  BP Readings from Last 3 Encounters:  08/27/16 132/64  05/27/16 130/70  02/26/16 128/64    He was last seen for hypertension 4 months ago.  BP at that visit was 132/64. Management changes since that visit include no changes. He reports excellent compliance with treatment. He is not having side effects.  He is not exercising. He is adherent to low salt diet.   Outside blood pressures are stable. He is experiencing none.  Patient denies chest pain.   Cardiovascular risk factors include advanced age (older than 58 for men, 15 for women), diabetes mellitus, hypertension and  smoking/ tobacco exposure.  Use of agents associated with hypertension: none.     Weight trend: stable Wt Readings from Last 3 Encounters:  08/27/16 210 lb 6.4 oz (95.4 kg)  02/26/16 210 lb (95.3 kg)  11/13/15 209 lb (94.8 kg)    Current diet: in general, a "healthy" diet    ------------------------------------------------------------------------      Allergies  Allergen Reactions  . Amlodipine     BP drops severly  . Nitrofuran Derivatives   . Povidone-Iodine      Current Outpatient Prescriptions:  .  aspirin 81 MG tablet, Take 81 mg by mouth daily.  , Disp: , Rfl:  .  cilostazol (PLETAL) 100 MG tablet, Take 1 tablet (100 mg total) by mouth 2 (two) times daily., Disp: 180 tablet, Rfl: 3 .  docusate sodium (COLACE) 50 MG capsule, DOCUSATE SODIUM, 50MG  (Oral Capsule)  one every day for 0 days  Quantity: 0.00;  Refills: 0   Ordered :14-Jan-2011  Teena Dunk ;  Started 26-Jun-2009 Active, Disp: , Rfl:  .  famotidine (PEPCID) 20 MG tablet, TAKE ONE TABLET BY MOUTH TWICE DAILY, Disp: 180 tablet, Rfl: 1 .  FREESTYLE LITE test strip, USE ONE STRIP TO CHECK GLUCOSE TWICE DAILY AND AS NEEDED, Disp: 200 each, Rfl: 3 .  gabapentin (NEURONTIN) 300 MG capsule, Take 1 capsule (300 mg total) by mouth at bedtime., Disp: 90 capsule, Rfl: 3 .  insulin NPH-insulin regular (NOVOLIN 70/30) (70-30) 100 UNIT/ML injection, Inject into  the skin as directed.  , Disp: , Rfl:  .  ipratropium-albuterol (DUONEB) 0.5-2.5 (3) MG/3ML SOLN, Take 3 mLs by nebulization every 4 (four) hours as needed., Disp: 1080 mL, Rfl: 3 .  iron polysaccharides (NIFEREX) 150 MG capsule, Take 150 mg by mouth 3 (three) times daily., Disp: , Rfl:  .  Lancets (FREESTYLE) lancets, USE TO TEST BLOOD SUGAR TWICE DAILY, Disp: 200 each, Rfl: 1 .  lisinopril (PRINIVIL,ZESTRIL) 10 MG tablet, TAKE ONE TABLET BY MOUTH ONCE DAILY, Disp: 90 tablet, Rfl: 3 .  loratadine (CLARITIN) 10 MG tablet, Take 10 mg by mouth daily.  , Disp: , Rfl:    .  montelukast (SINGULAIR) 10 MG tablet, TAKE ONE TABLET BY MOUTH AT BEDTIME, Disp: 90 tablet, Rfl: 3 .  niacin 500 MG CR capsule, NIACIN, 500MG  (Oral Capsule Extended Release)  1 Every Day for 0 days  Quantity: 0.00;  Refills: 0   Ordered :08-Oct-2010  [name] ;  Started 28-Mar-2009 Active, Disp: , Rfl:  .  Omega-3 Fatty Acids (FISH OIL) 1200 MG CAPS, Take by mouth daily.  , Disp: , Rfl:  .  pyridOXINE (VITAMIN B-6) 100 MG tablet, Take 100 mg by mouth daily.  , Disp: , Rfl:  .  simvastatin (ZOCOR) 20 MG tablet, Take 1 tablet (20 mg total) by mouth daily., Disp: 90 tablet, Rfl: 3 .  SYMBICORT 160-4.5 MCG/ACT inhaler, INHALE TWO PUFFS BY MOUTH TWICE DAILY, Disp: 3 Inhaler, Rfl: 1  Review of Systems  Constitutional: Negative.   Respiratory: Negative.   Cardiovascular: Negative.   Gastrointestinal: Negative.   Endocrine: Negative.   Neurological: Negative.   No changes from chronic issues  Social History  Substance Use Topics  . Smoking status: Former Smoker    Packs/day: 3.00    Years: 62.00    Types: Cigarettes    Quit date: 08/08/2010  . Smokeless tobacco: Never Used  . Alcohol use No     Comment: former heavy alcohol use   Objective:   There were no vitals taken for this visit.  Physical Exam  Constitutional: He appears well-developed and well-nourished. No distress.  HENT:  Head: Normocephalic and atraumatic.  Neck: Normal range of motion. Neck supple. No tracheal deviation present. No thyromegaly present.  Cardiovascular: Normal rate, regular rhythm and normal heart sounds.  Exam reveals no gallop and no friction rub.   No murmur heard. Pulmonary/Chest: Effort normal. No respiratory distress. He has wheezes (scattered wheezes but improved from previous checks). He has no rales.  Musculoskeletal: He exhibits no edema.  Lymphadenopathy:    He has no cervical adenopathy.  Skin: He is not diaphoretic.  Vitals reviewed.      Assessment & Plan:     1. Chronic  obstructive pulmonary disease, unspecified COPD type ([place]) Stable. Diagnosis pulled for medication refill. Continue current medical treatment plan. - budesonide-formoterol (SYMBICORT) 160-4.5 MCG/ACT inhaler; Inhale 2 puffs into the lungs 2 (two) times daily.  Dispense: 3 Inhaler; Refill: 3 - ipratropium-albuterol (DUONEB) 0.5-2.5 (3) MG/3ML SOLN; Take 3 mLs by nebulization every 4 (four) hours as needed.  Dispense: 1080 mL; Refill: 3  2. Pure hypercholesterolemia Stable. Diagnosis pulled for medication refill. Continue current medical treatment plan. - simvastatin (ZOCOR) 20 MG tablet; Take 1 tablet (20 mg total) by mouth daily.  Dispense: 90 tablet; Refill: 3  3. Type 2 diabetes mellitus with diabetic nephropathy, with long-term current use of insulin (HCC) HgBA1c unable to run today in the office due to low total hemoglobin.  He does have CKD stage 3 and anemia. This has not needed treatment in the past but we will check labs as below. I will follow up pending these lab results.  - cilostazol (PLETAL) 100 MG tablet; Take 1 tablet (100 mg total) by mouth 2 (two) times daily.  Dispense: 180 tablet; Refill: 3 - HgB A1c - Comprehensive Metabolic Panel (CMET)  4. Neuropathy (HCC) Stable. Diagnosis pulled for medication refill. Continue current medical treatment plan. - cilostazol (PLETAL) 100 MG tablet; Take 1 tablet (100 mg total) by mouth 2 (two) times daily.  Dispense: 180 tablet; Refill: 3 - gabapentin (NEURONTIN) 300 MG capsule; Take 1 capsule (300 mg total) by mouth at bedtime.  Dispense: 90 capsule; Refill: 3  5. Gastroesophageal reflux disease, esophagitis presence not specified Stable. Diagnosis pulled for medication refill. Continue current medical treatment plan. - famotidine (PEPCID) 20 MG tablet; Take 1 tablet (20 mg total) by mouth 2 (two) times daily.  Dispense: 180 tablet; Refill: 3  6. Anemia in stage 3 chronic kidney disease See medical treatment plan for #3.  - CBC  w/Diff/Platelet - Comprehensive Metabolic Panel (CMET)       Mar Daring, PA-C  [facility]

## 2016-12-14 DIAGNOSIS — N183 Chronic kidney disease, stage 3 (moderate): Secondary | ICD-10-CM | POA: Diagnosis not present

## 2016-12-14 DIAGNOSIS — D631 Anemia in chronic kidney disease: Secondary | ICD-10-CM | POA: Diagnosis not present

## 2016-12-14 DIAGNOSIS — E1121 Type 2 diabetes mellitus with diabetic nephropathy: Secondary | ICD-10-CM | POA: Diagnosis not present

## 2016-12-14 DIAGNOSIS — Z794 Long term (current) use of insulin: Secondary | ICD-10-CM | POA: Diagnosis not present

## 2016-12-15 ENCOUNTER — Telehealth: Payer: Self-pay

## 2016-12-15 LAB — COMPREHENSIVE METABOLIC PANEL
A/G RATIO: 1.6 (ref 1.2–2.2)
ALBUMIN: 3.6 g/dL (ref 3.5–4.8)
ALK PHOS: 54 IU/L (ref 39–117)
ALT: 14 IU/L (ref 0–44)
AST: 10 IU/L (ref 0–40)
BUN / CREAT RATIO: 14 (ref 10–24)
BUN: 25 mg/dL (ref 8–27)
CO2: 20 mmol/L (ref 18–29)
CREATININE: 1.74 mg/dL — AB (ref 0.76–1.27)
Calcium: 8.8 mg/dL (ref 8.6–10.2)
Chloride: 103 mmol/L (ref 96–106)
GFR calc Af Amer: 42 mL/min/{1.73_m2} — ABNORMAL LOW (ref >59)
GFR calc non Af Amer: 36 mL/min/{1.73_m2} — ABNORMAL LOW (ref >59)
GLOBULIN, TOTAL: 2.2 g/dL (ref 1.5–4.5)
Glucose: 131 mg/dL — ABNORMAL HIGH (ref 65–99)
POTASSIUM: 4.8 mmol/L (ref 3.5–5.2)
SODIUM: 140 mmol/L (ref 134–144)
Total Protein: 5.8 g/dL — ABNORMAL LOW (ref 6.0–8.5)

## 2016-12-15 LAB — CBC WITH DIFFERENTIAL/PLATELET
BASOS ABS: 0 10*3/uL (ref 0.0–0.2)
BASOS: 0 %
EOS (ABSOLUTE): 0.3 10*3/uL (ref 0.0–0.4)
Eos: 4 %
Hematocrit: 28.6 % — ABNORMAL LOW (ref 37.5–51.0)
Hemoglobin: 9 g/dL — ABNORMAL LOW (ref 13.0–17.7)
Immature Grans (Abs): 0 10*3/uL (ref 0.0–0.1)
Immature Granulocytes: 0 %
Lymphocytes Absolute: 1.5 10*3/uL (ref 0.7–3.1)
Lymphs: 20 %
MCH: 29.7 pg (ref 26.6–33.0)
MCHC: 31.5 g/dL (ref 31.5–35.7)
MCV: 94 fL (ref 79–97)
MONOS ABS: 0.5 10*3/uL (ref 0.1–0.9)
Monocytes: 7 %
NEUTROS ABS: 4.9 10*3/uL (ref 1.4–7.0)
Neutrophils: 69 %
PLATELETS: 233 10*3/uL (ref 150–379)
RBC: 3.03 x10E6/uL — ABNORMAL LOW (ref 4.14–5.80)
RDW: 14.2 % (ref 12.3–15.4)
WBC: 7.2 10*3/uL (ref 3.4–10.8)

## 2016-12-15 LAB — HEMOGLOBIN A1C
Est. average glucose Bld gHb Est-mCnc: 120 mg/dL
HEMOGLOBIN A1C: 5.8 % — AB (ref 4.8–5.6)

## 2016-12-15 NOTE — Telephone Encounter (Signed)
Patient advised as below. Patient reports he is taking NIFEREX 150 mg TID. Patient reports that he had to switch to NIFEREX due to intolerance of Ferrous Sulfate. Please advise. Thank you. sd

## 2016-12-15 NOTE — Telephone Encounter (Signed)
Ok that is fine. I just overlooked the iron when I skimmed to med list.  I do recommend him letting Dr. Candiss Norse know then to make sure this is not a worsening of his anemia of chronic disease secondary to his kidney function (which was stable). Dr. Candiss Norse had mentioned procrit or EPO injections if anemia worsened.

## 2016-12-15 NOTE — Telephone Encounter (Signed)
-----   Message from Mar Daring, PA-C sent at 12/15/2016  8:29 AM EST ----- A1c is much improved at 5.8. HgB has dropped from 10.1 6 months ago to 9.0. Recommend starting ferrous sulfate 325mg  BID (if tolerated). Can cause GI upset and dark stools. We should recheck his CBC in 6-8 weeks to see if any improvement or worsening. I do recommend notifying Dr. Candiss Norse (nephrologist) as well.

## 2016-12-15 NOTE — Telephone Encounter (Signed)
Patient verbalizes understanding and is in agreement with treatment plan.

## 2017-03-04 ENCOUNTER — Ambulatory Visit (INDEPENDENT_AMBULATORY_CARE_PROVIDER_SITE_OTHER): Payer: PPO | Admitting: Physician Assistant

## 2017-03-04 ENCOUNTER — Encounter: Payer: Self-pay | Admitting: Physician Assistant

## 2017-03-04 VITALS — BP 120/68 | HR 78 | Temp 98.3°F | Resp 16 | Wt 211.0 lb

## 2017-03-04 DIAGNOSIS — Z794 Long term (current) use of insulin: Secondary | ICD-10-CM

## 2017-03-04 DIAGNOSIS — N183 Chronic kidney disease, stage 3 unspecified: Secondary | ICD-10-CM

## 2017-03-04 DIAGNOSIS — E1121 Type 2 diabetes mellitus with diabetic nephropathy: Secondary | ICD-10-CM | POA: Diagnosis not present

## 2017-03-04 DIAGNOSIS — I129 Hypertensive chronic kidney disease with stage 1 through stage 4 chronic kidney disease, or unspecified chronic kidney disease: Secondary | ICD-10-CM

## 2017-03-04 DIAGNOSIS — J449 Chronic obstructive pulmonary disease, unspecified: Secondary | ICD-10-CM | POA: Diagnosis not present

## 2017-03-04 LAB — POCT GLYCOSYLATED HEMOGLOBIN (HGB A1C)
ESTIMATED AVERAGE GLUCOSE: 163
HEMOGLOBIN A1C: 7.3

## 2017-03-04 NOTE — Patient Instructions (Signed)
Diabetes Mellitus and Food It is important for you to manage your blood sugar (glucose) level. Your blood glucose level can be greatly affected by what you eat. Eating healthier foods in the appropriate amounts throughout the day at about the same time each day will help you control your blood glucose level. It can also help slow or prevent worsening of your diabetes mellitus. Healthy eating may even help you improve the level of your blood pressure and reach or maintain a healthy weight. General recommendations for healthful eating and cooking habits include:  Eating meals and snacks regularly. Avoid going long periods of time without eating to lose weight.  Eating a diet that consists mainly of plant-based foods, such as fruits, vegetables, nuts, legumes, and whole grains.  Using low-heat cooking methods, such as baking, instead of high-heat cooking methods, such as deep frying.  Work with your dietitian to make sure you understand how to use the Nutrition Facts information on food labels. How can food affect me? Carbohydrates Carbohydrates affect your blood glucose level more than any other type of food. Your dietitian will help you determine how many carbohydrates to eat at each meal and teach you how to count carbohydrates. Counting carbohydrates is important to keep your blood glucose at a healthy level, especially if you are using insulin or taking certain medicines for diabetes mellitus. Alcohol Alcohol can cause sudden decreases in blood glucose (hypoglycemia), especially if you use insulin or take certain medicines for diabetes mellitus. Hypoglycemia can be a life-threatening condition. Symptoms of hypoglycemia (sleepiness, dizziness, and disorientation) are similar to symptoms of having too much alcohol. If your health care provider has given you approval to drink alcohol, do so in moderation and use the following guidelines:  Women should not have more than one drink per day, and men  should not have more than two drinks per day. One drink is equal to: ? 12 oz of beer. ? 5 oz of wine. ? 1 oz of hard liquor.  Do not drink on an empty stomach.  Keep yourself hydrated. Have water, diet soda, or unsweetened iced tea.  Regular soda, juice, and other mixers might contain a lot of carbohydrates and should be counted.  What foods are not recommended? As you make food choices, it is important to remember that all foods are not the same. Some foods have fewer nutrients per serving than other foods, even though they might have the same number of calories or carbohydrates. It is difficult to get your body what it needs when you eat foods with fewer nutrients. Examples of foods that you should avoid that are high in calories and carbohydrates but low in nutrients include:  Trans fats (most processed foods list trans fats on the Nutrition Facts label).  Regular soda.  Juice.  Candy.  Sweets, such as cake, pie, doughnuts, and cookies.  Fried foods.  What foods can I eat? Eat nutrient-rich foods, which will nourish your body and keep you healthy. The food you should eat also will depend on several factors, including:  The calories you need.  The medicines you take.  Your weight.  Your blood glucose level.  Your blood pressure level.  Your cholesterol level.  You should eat a variety of foods, including:  Protein. ? Lean cuts of meat. ? Proteins low in saturated fats, such as fish, egg whites, and beans. Avoid processed meats.  Fruits and vegetables. ? Fruits and vegetables that may help control blood glucose levels, such as apples,   mangoes, and yams.  Dairy products. ? Choose fat-free or low-fat dairy products, such as milk, yogurt, and cheese.  Grains, bread, pasta, and rice. ? Choose whole grain products, such as multigrain bread, whole oats, and brown rice. These foods may help control blood pressure.  Fats. ? Foods containing healthful fats, such as  nuts, avocado, olive oil, canola oil, and fish.  Does everyone with diabetes mellitus have the same meal plan? Because every person with diabetes mellitus is different, there is not one meal plan that works for everyone. It is very important that you meet with a dietitian who will help you create a meal plan that is just right for you. This information is not intended to replace advice given to you by your health care provider. Make sure you discuss any questions you have with your health care provider. Document Released: 08/06/2005 Document Revised: 04/16/2016 Document Reviewed: 10/06/2013 Elsevier Interactive Patient Education  2017 [employer]vier Inc.  

## 2017-03-04 NOTE — Progress Notes (Signed)
Patient: Erik Gibbs Male    DOB: 10-17-1935   82 y.o.   MRN: [id number] Visit Date: 03/04/2017  Today's Provider: Mar Daring, PA-C   Chief Complaint  Patient presents with  . Diabetes  . COPD  . Anemia   Subjective:    HPI  Diabetes Mellitus Type II, Follow-up:   Lab Results  Component Value Date   HGBA1C 7.3 03/04/2017   HGBA1C 5.8 (H) 12/14/2016   HGBA1C 6.9 08/27/2016    Last seen for diabetes 3 months ago.  Management since then includes no changes. He reports excellent compliance with treatment. He is having side effects.  Current symptoms include none and have been stable. Home blood sugar records: fasting range: 120  Episodes of hypoglycemia? yes - 40's about 4 weeks ago   Current Insulin Regimen: 37 units BID reduced about 4 weeks ago Most Recent Eye Exam:  Weight trend: stable Prior visit with dietician: no Current diet: in general, a "healthy" diet   Current exercise: yard work  Pertinent Labs:    Component Value Date/Time   CHOL 115 05/28/2016 0803   TRIG 91 05/28/2016 0803   HDL 52 05/28/2016 0803   LDLCALC 45 05/28/2016 0803   CREATININE 1.74 (H) 12/14/2016 0923    Wt Readings from Last 3 Encounters:  03/04/17 211 lb (95.7 kg)  08/27/16 210 lb 6.4 oz (95.4 kg)  02/26/16 210 lb (95.3 kg)    ------------------------------------------------------------------------  Follow up for COPD, Anemia  The patient was last seen for this 3 months ago. Changes made at last visit include no changes.  He reports excellent compliance with treatment. He feels that condition is Unchanged. He is having side effects.  ------------------------------------------------------------------------------------     Allergies  Allergen Reactions  . Amlodipine     BP drops severly  . Nitrofuran Derivatives   . Povidone-Iodine      Current Outpatient Prescriptions:  .  aspirin 81 MG tablet, Take 81 mg by mouth daily.  , Disp: , Rfl:  .   budesonide-formoterol (SYMBICORT) 160-4.5 MCG/ACT inhaler, Inhale 2 puffs into the lungs 2 (two) times daily., Disp: 3 Inhaler, Rfl: 3 .  cilostazol (PLETAL) 100 MG tablet, Take 1 tablet (100 mg total) by mouth 2 (two) times daily., Disp: 180 tablet, Rfl: 3 .  docusate sodium (COLACE) 50 MG capsule, DOCUSATE SODIUM, 50MG  (Oral Capsule)  one every day for 0 days  Quantity: 0.00;  Refills: 0   Ordered :14-Jan-2011  Teena Dunk ;  Started 26-Jun-2009 Active, Disp: , Rfl:  .  famotidine (PEPCID) 20 MG tablet, Take 1 tablet (20 mg total) by mouth 2 (two) times daily., Disp: 180 tablet, Rfl: 3 .  FREESTYLE LITE test strip, USE ONE STRIP TO CHECK GLUCOSE TWICE DAILY AND AS NEEDED, Disp: 200 each, Rfl: 3 .  gabapentin (NEURONTIN) 300 MG capsule, Take 1 capsule (300 mg total) by mouth at bedtime., Disp: 90 capsule, Rfl: 3 .  insulin NPH-insulin regular (NOVOLIN 70/30) (70-30) 100 UNIT/ML injection, Inject into the skin as directed.  , Disp: , Rfl:  .  ipratropium-albuterol (DUONEB) 0.5-2.5 (3) MG/3ML SOLN, Take 3 mLs by nebulization every 4 (four) hours as needed., Disp: 1080 mL, Rfl: 3 .  iron polysaccharides (NIFEREX) 150 MG capsule, Take 150 mg by mouth 3 (three) times daily., Disp: , Rfl:  .  Lancets (FREESTYLE) lancets, USE TO TEST BLOOD SUGAR TWICE DAILY, Disp: 200 each, Rfl: 1 .  lisinopril (PRINIVIL,ZESTRIL) 10 MG  tablet, TAKE ONE TABLET BY MOUTH ONCE DAILY, Disp: 90 tablet, Rfl: 3 .  loratadine (CLARITIN) 10 MG tablet, Take 10 mg by mouth daily.  , Disp: , Rfl:  .  montelukast (SINGULAIR) 10 MG tablet, TAKE ONE TABLET BY MOUTH AT BEDTIME, Disp: 90 tablet, Rfl: 3 .  niacin 500 MG CR capsule, NIACIN, 500MG  (Oral Capsule Extended Release)  1 Every Day for 0 days  Quantity: 0.00;  Refills: 0   Ordered :08-Oct-2010  [name] ;  Started 28-Mar-2009 Active, Disp: , Rfl:  .  Omega-3 Fatty Acids (FISH OIL) 1200 MG CAPS, Take by mouth daily.  , Disp: , Rfl:  .  pyridOXINE (VITAMIN B-6) 100 MG  tablet, Take 100 mg by mouth daily.  , Disp: , Rfl:  .  simvastatin (ZOCOR) 20 MG tablet, Take 1 tablet (20 mg total) by mouth daily., Disp: 90 tablet, Rfl: 3  Review of Systems  Constitutional: Negative.   HENT: Negative.   Respiratory: Positive for shortness of breath and wheezing. Negative for cough and chest tightness.   Cardiovascular: Negative.   Neurological: Negative for dizziness and headaches.    Social History  Substance Use Topics  . Smoking status: Former Smoker    Packs/day: 3.00    Years: 62.00    Types: Cigarettes    Quit date: 08/08/2010  . Smokeless tobacco: Never Used  . Alcohol use No     Comment: former heavy alcohol use   Objective:   BP 120/68 (BP Location: Left Arm, Patient Position: Sitting, Cuff Size: Large)   Pulse 78   Temp 98.3 F (36.8 C) (Oral)   Resp 16   Wt 211 lb (95.7 kg)   SpO2 (!) 87%   BMI 32.08 kg/m  Vitals:   03/04/17 0813  BP: 120/68  Pulse: 78  Resp: 16  Temp: 98.3 F (36.8 C)  TempSrc: Oral  SpO2: (!) 87%  Weight: 211 lb (95.7 kg)     Physical Exam  Constitutional: He appears well-developed and well-nourished. No distress.  HENT:  Head: Normocephalic and atraumatic.  Neck: Normal range of motion. Neck supple. No JVD present. No tracheal deviation present. No thyromegaly present.  Cardiovascular: Normal rate, regular rhythm and normal heart sounds.  Exam reveals no gallop and no friction rub.   No murmur heard. Pulmonary/Chest: Effort normal. No respiratory distress. He has wheezes (scattered expiratory throughout). He has no rales.  Musculoskeletal: He exhibits edema (trace).  Lymphadenopathy:    He has no cervical adenopathy.  Skin: He is not diaphoretic.  Vitals reviewed.  Diabetic Foot Exam - Simple   Simple Foot Form Diabetic Foot exam was performed with the following findings:  Yes 03/04/2017  8:30 AM  Visual Inspection No deformities, no ulcerations, no other skin breakdown bilaterally:  Yes Sensation  Testing See comments:  Yes Pulse Check Posterior Tibialis and Dorsalis pulse intact bilaterally:  Yes Comments Diminished monofilament testing at toes and ball of foot bilaterally, otherwise normal        Assessment & Plan:     1. Type 2 diabetes mellitus with diabetic nephropathy, with long-term current use of insulin (HCC) A1c fairly stable at 7.3. Wife lowered insulin to 37 units from 44 units due to hypoglycemic episodes. Will continue current dose of 37 units and will recheck in 3 months. If A1c increases we may try to increase basal insulin back up to 40 units. If he still has hypo episodes then we may decrease basal and add a bolus.  -  POCT glycosylated hemoglobin (Hb A1C)  2. Benign hypertension with CKD (chronic kidney disease) stage III Stable. Followed by Dr. Candiss Norse. Has appointment with him in the next coming months.   3. Chronic obstructive pulmonary disease, unspecified COPD type ([place]) Stable.        Mar Daring, PA-C  [facility]

## 2017-05-05 DIAGNOSIS — E1129 Type 2 diabetes mellitus with other diabetic kidney complication: Secondary | ICD-10-CM | POA: Diagnosis not present

## 2017-05-05 DIAGNOSIS — R600 Localized edema: Secondary | ICD-10-CM | POA: Diagnosis not present

## 2017-05-05 DIAGNOSIS — I10 Essential (primary) hypertension: Secondary | ICD-10-CM | POA: Diagnosis not present

## 2017-05-05 DIAGNOSIS — N183 Chronic kidney disease, stage 3 (moderate): Secondary | ICD-10-CM | POA: Diagnosis not present

## 2017-06-09 ENCOUNTER — Ambulatory Visit (INDEPENDENT_AMBULATORY_CARE_PROVIDER_SITE_OTHER): Payer: PPO | Admitting: Physician Assistant

## 2017-06-09 ENCOUNTER — Encounter: Payer: Self-pay | Admitting: Physician Assistant

## 2017-06-09 VITALS — BP 110/80 | HR 84 | Temp 98.6°F | Resp 16 | Wt 207.4 lb

## 2017-06-09 DIAGNOSIS — Z8551 Personal history of malignant neoplasm of bladder: Secondary | ICD-10-CM | POA: Diagnosis not present

## 2017-06-09 DIAGNOSIS — N183 Chronic kidney disease, stage 3 unspecified: Secondary | ICD-10-CM

## 2017-06-09 DIAGNOSIS — I129 Hypertensive chronic kidney disease with stage 1 through stage 4 chronic kidney disease, or unspecified chronic kidney disease: Secondary | ICD-10-CM | POA: Diagnosis not present

## 2017-06-09 DIAGNOSIS — J449 Chronic obstructive pulmonary disease, unspecified: Secondary | ICD-10-CM | POA: Diagnosis not present

## 2017-06-09 DIAGNOSIS — E782 Mixed hyperlipidemia: Secondary | ICD-10-CM | POA: Diagnosis not present

## 2017-06-09 DIAGNOSIS — Z6831 Body mass index (BMI) 31.0-31.9, adult: Secondary | ICD-10-CM

## 2017-06-09 DIAGNOSIS — Z794 Long term (current) use of insulin: Secondary | ICD-10-CM

## 2017-06-09 DIAGNOSIS — D692 Other nonthrombocytopenic purpura: Secondary | ICD-10-CM

## 2017-06-09 DIAGNOSIS — D508 Other iron deficiency anemias: Secondary | ICD-10-CM

## 2017-06-09 DIAGNOSIS — E1121 Type 2 diabetes mellitus with diabetic nephropathy: Secondary | ICD-10-CM

## 2017-06-09 DIAGNOSIS — I130 Hypertensive heart and chronic kidney disease with heart failure and stage 1 through stage 4 chronic kidney disease, or unspecified chronic kidney disease: Secondary | ICD-10-CM

## 2017-06-09 LAB — POCT GLYCOSYLATED HEMOGLOBIN (HGB A1C)
ESTIMATED AVERAGE GLUCOSE: 140
Hemoglobin A1C: 6.5

## 2017-06-09 MED ORDER — FREESTYLE LANCETS MISC: 5 refills | Status: DC

## 2017-06-09 NOTE — Progress Notes (Signed)
Patient: Erik Gibbs Male    DOB: 13-Jul-1935   82 y.o.   MRN: [id number] Visit Date: 06/09/2017  Today's Provider: Mar Daring, PA-C   Chief Complaint  Patient presents with  . Diabetes  . Hypertension  . COPD   Subjective:    HPI  Diabetes Mellitus Type II, Follow-up:   Lab Results  Component Value Date   HGBA1C 6.5 06/09/2017   HGBA1C 7.3 03/04/2017   HGBA1C 5.8 (H) 12/14/2016    Last seen for diabetes 3 months ago.  Management since then includes no changes. He reports excellent compliance with treatment. He is not having side effects.  Current symptoms include none and have been stable. Home blood sugar records: fasting range: 120's  Episodes of hypoglycemia? yes -    Current Insulin Regimen: 38 units BID Most Recent Eye Exam: UTD Weight trend: stable Prior visit with dietician: no Current diet: in general, a "healthy" diet   Current exercise: housecleaning  Pertinent Labs:    Component Value Date/Time   CHOL 115 05/28/2016 0803   TRIG 91 05/28/2016 0803   HDL 52 05/28/2016 0803   LDLCALC 45 05/28/2016 0803   CREATININE 1.74 (H) 12/14/2016 0923    Wt Readings from Last 3 Encounters:  06/09/17 207 lb 6.4 oz (94.1 kg)  03/04/17 211 lb (95.7 kg)  08/27/16 210 lb 6.4 oz (95.4 kg)    ------------------------------------------------------------------------   Hypertension, follow-up:  BP Readings from Last 3 Encounters:  06/09/17 110/80  03/04/17 120/68  08/27/16 132/64    He was last seen for hypertension 3 months ago.  BP at that visit was stable. Management changes since that visit include no changes. He reports excellent compliance with treatment. He is not having side effects.  He is not exercising. He is adherent to low salt diet.   Outside blood pressures are stable. He is experiencing none.  Patient denies chest pain.   Cardiovascular risk factors include advanced age (older than 90 for men, 86 for women).  Use of  agents associated with hypertension: none.     Weight trend: stable Wt Readings from Last 3 Encounters:  06/09/17 207 lb 6.4 oz (94.1 kg)  03/04/17 211 lb (95.7 kg)  08/27/16 210 lb 6.4 oz (95.4 kg)    Current diet: in general, a "healthy" diet    ------------------------------------------------------------------------   Follow up for COPD  The patient was last seen for this 3 months ago. Changes made at last visit include no changes.  He reports excellent compliance with treatment. He feels that condition is Unchanged. He is not having side effects.   ------------------------------------------------------------------------------------       Allergies  Allergen Reactions  . Amlodipine     BP drops severly  . Nitrofuran Derivatives   . Povidone-Iodine      Current Outpatient Prescriptions:  .  aspirin 81 MG tablet, Take 81 mg by mouth daily.  , Disp: , Rfl:  .  budesonide-formoterol (SYMBICORT) 160-4.5 MCG/ACT inhaler, Inhale 2 puffs into the lungs 2 (two) times daily., Disp: 3 Inhaler, Rfl: 3 .  cilostazol (PLETAL) 100 MG tablet, Take 1 tablet (100 mg total) by mouth 2 (two) times daily., Disp: 180 tablet, Rfl: 3 .  docusate sodium (COLACE) 50 MG capsule, DOCUSATE SODIUM, 50MG  (Oral Capsule)  one every day for 0 days  Quantity: 0.00;  Refills: 0   Ordered :14-Jan-2011  Teena Dunk ;  Started 26-Jun-2009 Active, Disp: , Rfl:  .  famotidine (PEPCID) 20 MG tablet, Take 1 tablet (20 mg total) by mouth 2 (two) times daily., Disp: 180 tablet, Rfl: 3 .  FREESTYLE LITE test strip, USE ONE STRIP TO CHECK GLUCOSE TWICE DAILY AND AS NEEDED, Disp: 200 each, Rfl: 3 .  gabapentin (NEURONTIN) 300 MG capsule, Take 1 capsule (300 mg total) by mouth at bedtime., Disp: 90 capsule, Rfl: 3 .  insulin NPH-insulin regular (NOVOLIN 70/30) (70-30) 100 UNIT/ML injection, Inject into the skin as directed.  , Disp: , Rfl:  .  ipratropium-albuterol (DUONEB) 0.5-2.5 (3) MG/3ML SOLN, Take 3  mLs by nebulization every 4 (four) hours as needed., Disp: 1080 mL, Rfl: 3 .  iron polysaccharides (NIFEREX) 150 MG capsule, Take 150 mg by mouth 3 (three) times daily., Disp: , Rfl:  .  Lancets (FREESTYLE) lancets, USE TO TEST BLOOD SUGAR TWICE DAILY, Disp: 200 each, Rfl: 1 .  lisinopril (PRINIVIL,ZESTRIL) 10 MG tablet, TAKE ONE TABLET BY MOUTH ONCE DAILY, Disp: 90 tablet, Rfl: 3 .  loratadine (CLARITIN) 10 MG tablet, Take 10 mg by mouth daily.  , Disp: , Rfl:  .  montelukast (SINGULAIR) 10 MG tablet, TAKE ONE TABLET BY MOUTH AT BEDTIME, Disp: 90 tablet, Rfl: 3 .  niacin 500 MG CR capsule, NIACIN, 500MG  (Oral Capsule Extended Release)  1 Every Day for 0 days  Quantity: 0.00;  Refills: 0   Ordered :08-Oct-2010  [name] ;  Started 28-Mar-2009 Active, Disp: , Rfl:  .  Omega-3 Fatty Acids (FISH OIL) 1200 MG CAPS, Take by mouth daily.  , Disp: , Rfl:  .  pyridOXINE (VITAMIN B-6) 100 MG tablet, Take 100 mg by mouth daily.  , Disp: , Rfl:  .  simvastatin (ZOCOR) 20 MG tablet, Take 1 tablet (20 mg total) by mouth daily., Disp: 90 tablet, Rfl: 3  Review of Systems  Constitutional: Negative.   HENT: Negative.   Respiratory: Positive for shortness of breath and wheezing.   Cardiovascular: Negative.   Gastrointestinal: Negative.   Genitourinary: Negative.   Neurological: Negative.     Social History  Substance Use Topics  . Smoking status: Former Smoker    Packs/day: 3.00    Years: 62.00    Types: Cigarettes    Quit date: 08/08/2010  . Smokeless tobacco: Never Used  . Alcohol use No     Comment: former heavy alcohol use   Objective:   BP 110/80 (BP Location: Left Arm, Patient Position: Sitting, Cuff Size: Large)   Pulse 84   Temp 98.6 F (37 C) (Oral)   Resp 16   Wt 207 lb 6.4 oz (94.1 kg)   SpO2 90%   BMI 31.54 kg/m  Vitals:   06/09/17 0821  BP: 110/80  Pulse: 84  Resp: 16  Temp: 98.6 F (37 C)  TempSrc: Oral  SpO2: 90%  Weight: 207 lb 6.4 oz (94.1 kg)     Physical  Exam  Constitutional: He appears well-developed and well-nourished. No distress.  HENT:  Head: Normocephalic and atraumatic.  Neck: Normal range of motion. Neck supple. No JVD present. No tracheal deviation present. No thyromegaly present.  Cardiovascular: Normal rate, regular rhythm and normal heart sounds.  Exam reveals no gallop and no friction rub.   No murmur heard. Pulmonary/Chest: Effort normal. No respiratory distress. He has wheezes (throughout). He has no rales.  Musculoskeletal: He exhibits no edema.  Lymphadenopathy:    He has no cervical adenopathy.  Skin: He is not diaphoretic.  Vitals reviewed.  Assessment & Plan:     1. Type 2 diabetes mellitus with diabetic nephropathy, with long-term current use of insulin (HCC) A1c improved to 6.5. Continue Novolin 70/30 38 units daily. Will check labs as below and f/u pending results. - POCT glycosylated hemoglobin (Hb A1C) - CBC w/Diff/Platelet - Comprehensive Metabolic Panel (CMET) - Lipid Profile - Iron Binding Cap (TIBC) - Ferritin - B12 - Lancets (FREESTYLE) lancets; To check blood sugar twice daily  Dispense: 200 each; Refill: 5  2. Benign hypertension with CKD (chronic kidney disease) stage III BP controlled. Continue lisinopril 10 mg. Followed by Dr. Candiss Norse. Will check labs as below and f/u pending results. Will fax lab results to Dr. Candiss Norse as well.  - CBC w/Diff/Platelet - Comprehensive Metabolic Panel (CMET) - Lipid Profile - Iron Binding Cap (TIBC) - Ferritin - B12  3. Chronic obstructive pulmonary disease, unspecified COPD type ([place]) Stable. Continue symbicort, duoneb. Will check labs as below and f/u pending results. - CBC w/Diff/Platelet - Comprehensive Metabolic Panel (CMET) - Lipid Profile - Iron Binding Cap (TIBC) - Ferritin - B12  4. Heart & renal disease, hypertensive, with heart failure (HCC) Stable. Will check labs as below and f/u pending results. - CBC w/Diff/Platelet - Comprehensive  Metabolic Panel (CMET) - Lipid Profile - Iron Binding Cap (TIBC) - Ferritin - B12  5. Other iron deficiency anemia Continue Niferex 150mg . Will check labs as below and f/u pending results. Followed by Dr. Candiss Norse.  - CBC w/Diff/Platelet - Comprehensive Metabolic Panel (CMET) - Lipid Profile - Iron Binding Cap (TIBC) - Ferritin - B12  6. Combined fat and carbohydrate induced hyperlipemia Stable. Continue simvastatin 20mg  and omega 3 fish oil. Will check labs as below and f/u pending results. - CBC w/Diff/Platelet - Comprehensive Metabolic Panel (CMET) - Lipid Profile - Iron Binding Cap (TIBC) - Ferritin - B12  7. BMI 31.0-31.9,adult Counseled patient on healthy lifestyle modifications including dieting and exercise.  - CBC w/Diff/Platelet - Comprehensive Metabolic Panel (CMET) - Lipid Profile - Iron Binding Cap (TIBC) - Ferritin - B12  8. Senile purpura (HCC) Stable. Will check labs as below and f/u pending results. - CBC w/Diff/Platelet - Comprehensive Metabolic Panel (CMET) - Lipid Profile - Iron Binding Cap (TIBC) - Ferritin - B12  9. H/O primary malignant neoplasm of urinary bladder On yearly follow ups with Cysto with Dr. Jacqlyn Larsen. Will check labs as below and f/u pending results. Will fax labs to Dr. Jacqlyn Larsen.  - CBC w/Diff/Platelet - Comprehensive Metabolic Panel (CMET) - Lipid Profile - Iron Binding Cap (TIBC) - Ferritin - B12       Mar Daring, PA-C  [facility]

## 2017-06-09 NOTE — Patient Instructions (Signed)
Chronic Obstructive Pulmonary Disease Chronic obstructive pulmonary disease (COPD) is a common lung condition in which airflow from the lungs is limited. COPD is a general term that can be used to describe many different lung problems that limit airflow, including both chronic bronchitis and emphysema. If you have COPD, your lung function will probably never return to normal, but there are measures you can take to improve lung function and make yourself feel better. What are the causes?  Smoking (common).  Exposure to secondhand smoke.  Genetic problems.  Chronic inflammatory lung diseases or recurrent infections. What are the signs or symptoms?  Shortness of breath, especially with physical activity.  Deep, persistent (chronic) cough with a large amount of thick mucus.  Wheezing.  Rapid breaths (tachypnea).  Gray or bluish discoloration (cyanosis) of the skin, especially in your fingers, toes, or lips.  Fatigue.  Weight loss.  Frequent infections or episodes when breathing symptoms become much worse (exacerbations).  Chest tightness. How is this diagnosed? Your health care provider will take a medical history and perform a physical examination to diagnose COPD. Additional tests for COPD may include:  Lung (pulmonary) function tests.  Chest X-ray.  CT scan.  Blood tests. How is this treated? Treatment for COPD may include:  Inhaler and nebulizer medicines. These help manage the symptoms of COPD and make your breathing more comfortable.  Supplemental oxygen. Supplemental oxygen is only helpful if you have a low oxygen level in your blood.  Exercise and physical activity. These are beneficial for nearly all people with COPD.  Lung surgery or transplant.  Nutrition therapy to gain weight, if you are underweight.  Pulmonary rehabilitation. This may involve working with a team of health care providers and specialists, such as respiratory, occupational, and physical  therapists. Follow these instructions at home:  Take all medicines (inhaled or pills) as directed by your health care provider.  Avoid over-the-counter medicines or cough syrups that dry up your airway (such as antihistamines) and slow down the elimination of secretions unless instructed otherwise by your health care provider.  If you are a smoker, the most important thing that you can do is stop smoking. Continuing to smoke will cause further lung damage and breathing trouble. Ask your health care provider for help with quitting smoking. He or she can direct you to community resources or hospitals that provide support.  Avoid exposure to irritants such as smoke, chemicals, and fumes that aggravate your breathing.  Use oxygen therapy and pulmonary rehabilitation if directed by your health care provider. If you require home oxygen therapy, ask your health care provider whether you should purchase a pulse oximeter to measure your oxygen level at home.  Avoid contact with individuals who have a contagious illness.  Avoid extreme temperature and humidity changes.  Eat healthy foods. Eating smaller, more frequent meals and resting before meals may help you maintain your strength.  Stay active, but balance activity with periods of rest. Exercise and physical activity will help you maintain your ability to do things you want to do.  Preventing infection and hospitalization is very important when you have COPD. Make sure to receive all the vaccines your health care provider recommends, especially the pneumococcal and influenza vaccines. Ask your health care provider whether you need a pneumonia vaccine.  Learn and use relaxation techniques to manage stress.  Learn and use controlled breathing techniques as directed by your health care provider. Controlled breathing techniques include: 1. Pursed lip breathing. Start by breathing in (inhaling)   through your nose for 1 second. Then, purse your lips as  if you were going to whistle and breathe out (exhale) through the pursed lips for 2 seconds. 2. Diaphragmatic breathing. Start by putting one hand on your abdomen just above your waist. Inhale slowly through your nose. The hand on your abdomen should move out. Then purse your lips and exhale slowly. You should be able to feel the hand on your abdomen moving in as you exhale.  Learn and use controlled coughing to clear mucus from your lungs. Controlled coughing is a series of short, progressive coughs. The steps of controlled coughing are: 1. Lean your head slightly forward. 2. Breathe in deeply using diaphragmatic breathing. 3. Try to hold your breath for 3 seconds. 4. Keep your mouth slightly open while coughing twice. 5. Spit any mucus out into a tissue. 6. Rest and repeat the steps once or twice as needed. Contact a health care provider if:  You are coughing up more mucus than usual.  There is a change in the color or thickness of your mucus.  Your breathing is more labored than usual.  Your breathing is faster than usual. Get help right away if:  You have shortness of breath while you are resting.  You have shortness of breath that prevents you from:  Being able to talk.  Performing your usual physical activities.  You have chest pain lasting longer than 5 minutes.  Your skin color is more cyanotic than usual.  You measure low oxygen saturations for longer than 5 minutes with a pulse oximeter. This information is not intended to replace advice given to you by your health care provider. Make sure you discuss any questions you have with your health care provider. Document Released: 08/19/2005 Document Revised: 04/16/2016 Document Reviewed: 07/06/2013 [employer] Interactive Patient Education  2017 [employer]  

## 2017-06-11 DIAGNOSIS — D508 Other iron deficiency anemias: Secondary | ICD-10-CM | POA: Diagnosis not present

## 2017-06-11 DIAGNOSIS — I129 Hypertensive chronic kidney disease with stage 1 through stage 4 chronic kidney disease, or unspecified chronic kidney disease: Secondary | ICD-10-CM | POA: Diagnosis not present

## 2017-06-11 DIAGNOSIS — N183 Chronic kidney disease, stage 3 (moderate): Secondary | ICD-10-CM | POA: Diagnosis not present

## 2017-06-11 DIAGNOSIS — E782 Mixed hyperlipidemia: Secondary | ICD-10-CM | POA: Diagnosis not present

## 2017-06-11 DIAGNOSIS — Z6831 Body mass index (BMI) 31.0-31.9, adult: Secondary | ICD-10-CM | POA: Diagnosis not present

## 2017-06-11 DIAGNOSIS — J449 Chronic obstructive pulmonary disease, unspecified: Secondary | ICD-10-CM | POA: Diagnosis not present

## 2017-06-11 DIAGNOSIS — Z794 Long term (current) use of insulin: Secondary | ICD-10-CM | POA: Diagnosis not present

## 2017-06-11 DIAGNOSIS — Z8551 Personal history of malignant neoplasm of bladder: Secondary | ICD-10-CM | POA: Diagnosis not present

## 2017-06-11 DIAGNOSIS — D692 Other nonthrombocytopenic purpura: Secondary | ICD-10-CM | POA: Diagnosis not present

## 2017-06-11 DIAGNOSIS — E1121 Type 2 diabetes mellitus with diabetic nephropathy: Secondary | ICD-10-CM | POA: Diagnosis not present

## 2017-06-11 DIAGNOSIS — I130 Hypertensive heart and chronic kidney disease with heart failure and stage 1 through stage 4 chronic kidney disease, or unspecified chronic kidney disease: Secondary | ICD-10-CM | POA: Diagnosis not present

## 2017-06-12 LAB — COMPREHENSIVE METABOLIC PANEL
A/G RATIO: 1.4 (ref 1.2–2.2)
ALT: 12 IU/L (ref 0–44)
AST: 13 IU/L (ref 0–40)
Albumin: 4.3 g/dL (ref 3.5–4.8)
Alkaline Phosphatase: 50 IU/L (ref 39–117)
BUN/Creatinine Ratio: 18 (ref 10–24)
BUN: 37 mg/dL — ABNORMAL HIGH (ref 8–27)
CALCIUM: 9.5 mg/dL (ref 8.6–10.2)
CHLORIDE: 106 mmol/L (ref 96–106)
CO2: 23 mmol/L (ref 20–29)
Creatinine, Ser: 2.01 mg/dL — ABNORMAL HIGH (ref 0.76–1.27)
GFR calc Af Amer: 35 mL/min/{1.73_m2} — ABNORMAL LOW (ref >59)
GFR, EST NON AFRICAN AMERICAN: 31 mL/min/{1.73_m2} — AB (ref >59)
GLOBULIN, TOTAL: 3.1 g/dL (ref 1.5–4.5)
Glucose: 157 mg/dL — ABNORMAL HIGH (ref 65–99)
POTASSIUM: 5.2 mmol/L (ref 3.5–5.2)
SODIUM: 144 mmol/L (ref 134–144)
Total Protein: 7.4 g/dL (ref 6.0–8.5)

## 2017-06-12 LAB — CBC WITH DIFFERENTIAL/PLATELET
BASOS: 0 %
Basophils Absolute: 0 10*3/uL (ref 0.0–0.2)
EOS (ABSOLUTE): 0.6 10*3/uL — AB (ref 0.0–0.4)
EOS: 8 %
HEMATOCRIT: 29.1 % — AB (ref 37.5–51.0)
Hemoglobin: 9.4 g/dL — ABNORMAL LOW (ref 13.0–17.7)
Immature Grans (Abs): 0 10*3/uL (ref 0.0–0.1)
Immature Granulocytes: 1 %
LYMPHS ABS: 1.4 10*3/uL (ref 0.7–3.1)
Lymphs: 20 %
MCH: 30.4 pg (ref 26.6–33.0)
MCHC: 32.3 g/dL (ref 31.5–35.7)
MCV: 94 fL (ref 79–97)
MONOS ABS: 0.5 10*3/uL (ref 0.1–0.9)
Monocytes: 7 %
NEUTROS ABS: 4.6 10*3/uL (ref 1.4–7.0)
Neutrophils: 64 %
Platelets: 270 10*3/uL (ref 150–379)
RBC: 3.09 x10E6/uL — ABNORMAL LOW (ref 4.14–5.80)
RDW: 13.9 % (ref 12.3–15.4)
WBC: 7.1 10*3/uL (ref 3.4–10.8)

## 2017-06-12 LAB — LIPID PANEL
CHOL/HDL RATIO: 2.6 ratio (ref 0.0–5.0)
Cholesterol, Total: 118 mg/dL (ref 100–199)
HDL: 46 mg/dL (ref >39)
LDL Calculated: 54 mg/dL (ref 0–99)
TRIGLYCERIDES: 88 mg/dL (ref 0–149)
VLDL Cholesterol Cal: 18 mg/dL (ref 5–40)

## 2017-06-12 LAB — IRON AND TIBC
IRON SATURATION: 10 % — AB (ref 15–55)
IRON: 35 ug/dL — AB (ref 38–169)
TIBC: 346 ug/dL (ref 250–450)
UIBC: 311 ug/dL (ref 111–343)

## 2017-06-12 LAB — VITAMIN B12: Vitamin B-12: 860 pg/mL (ref 232–1245)

## 2017-06-12 LAB — FERRITIN: FERRITIN: 30 ng/mL (ref 30–400)

## 2017-09-09 ENCOUNTER — Encounter: Payer: Self-pay | Admitting: Physician Assistant

## 2017-09-09 ENCOUNTER — Ambulatory Visit (INDEPENDENT_AMBULATORY_CARE_PROVIDER_SITE_OTHER): Payer: PPO | Admitting: Physician Assistant

## 2017-09-09 VITALS — BP 120/68 | HR 85 | Temp 98.5°F | Resp 18 | Wt 210.8 lb

## 2017-09-09 DIAGNOSIS — E1121 Type 2 diabetes mellitus with diabetic nephropathy: Secondary | ICD-10-CM | POA: Diagnosis not present

## 2017-09-09 DIAGNOSIS — Z794 Long term (current) use of insulin: Secondary | ICD-10-CM

## 2017-09-09 DIAGNOSIS — Z23 Encounter for immunization: Secondary | ICD-10-CM | POA: Diagnosis not present

## 2017-09-09 LAB — POCT GLYCOSYLATED HEMOGLOBIN (HGB A1C)
Est. average glucose Bld gHb Est-mCnc: 146
Hemoglobin A1C: 6.7

## 2017-09-09 NOTE — Patient Instructions (Signed)

## 2017-09-09 NOTE — Progress Notes (Deleted)
Patient: Erik Gibbs Male    DOB: 1935-02-01   82 y.o.   MRN: [id number] Visit Date: 09/09/2017  Today's Provider: Mar Daring, PA-C   Chief Complaint  Patient presents with  . Follow-up    Diabetes   Subjective:    HPI     Allergies  Allergen Reactions  . Amlodipine     BP drops severly  . Nitrofuran Derivatives   . Povidone-Iodine      Current Outpatient Prescriptions:  .  aspirin 81 MG tablet, Take 81 mg by mouth daily.  , Disp: , Rfl:  .  budesonide-formoterol (SYMBICORT) 160-4.5 MCG/ACT inhaler, Inhale 2 puffs into the lungs 2 (two) times daily., Disp: 3 Inhaler, Rfl: 3 .  cilostazol (PLETAL) 100 MG tablet, Take 1 tablet (100 mg total) by mouth 2 (two) times daily., Disp: 180 tablet, Rfl: 3 .  docusate sodium (COLACE) 50 MG capsule, DOCUSATE SODIUM, 50MG  (Oral Capsule)  one every day for 0 days  Quantity: 0.00;  Refills: 0   Ordered :14-Jan-2011  Teena Dunk ;  Started 26-Jun-2009 Active, Disp: , Rfl:  .  famotidine (PEPCID) 20 MG tablet, Take 1 tablet (20 mg total) by mouth 2 (two) times daily., Disp: 180 tablet, Rfl: 3 .  FREESTYLE LITE test strip, USE ONE STRIP TO CHECK GLUCOSE TWICE DAILY AND AS NEEDED, Disp: 200 each, Rfl: 3 .  gabapentin (NEURONTIN) 300 MG capsule, Take 1 capsule (300 mg total) by mouth at bedtime., Disp: 90 capsule, Rfl: 3 .  insulin NPH-insulin regular (NOVOLIN 70/30) (70-30) 100 UNIT/ML injection, Inject into the skin as directed.  , Disp: , Rfl:  .  ipratropium-albuterol (DUONEB) 0.5-2.5 (3) MG/3ML SOLN, Take 3 mLs by nebulization every 4 (four) hours as needed., Disp: 1080 mL, Rfl: 3 .  iron polysaccharides (NIFEREX) 150 MG capsule, Take 150 mg by mouth 3 (three) times daily., Disp: , Rfl:  .  Lancets (FREESTYLE) lancets, To check blood sugar twice daily, Disp: 200 each, Rfl: 5 .  lisinopril (PRINIVIL,ZESTRIL) 10 MG tablet, TAKE ONE TABLET BY MOUTH ONCE DAILY, Disp: 90 tablet, Rfl: 3 .  loratadine (CLARITIN) 10 MG  tablet, Take 10 mg by mouth daily.  , Disp: , Rfl:  .  montelukast (SINGULAIR) 10 MG tablet, TAKE ONE TABLET BY MOUTH AT BEDTIME, Disp: 90 tablet, Rfl: 3 .  niacin 500 MG CR capsule, NIACIN, 500MG  (Oral Capsule Extended Release)  1 Every Day for 0 days  Quantity: 0.00;  Refills: 0   Ordered :08-Oct-2010  [name] ;  Started 28-Mar-2009 Active, Disp: , Rfl:  .  Omega-3 Fatty Acids (FISH OIL) 1200 MG CAPS, Take by mouth daily.  , Disp: , Rfl:  .  pyridOXINE (VITAMIN B-6) 100 MG tablet, Take 100 mg by mouth daily.  , Disp: , Rfl:  .  simvastatin (ZOCOR) 20 MG tablet, Take 1 tablet (20 mg total) by mouth daily., Disp: 90 tablet, Rfl: 3  Review of Systems  Social History  Substance Use Topics  . Smoking status: Former Smoker    Packs/day: 3.00    Years: 62.00    Types: Cigarettes    Quit date: 08/08/2010  . Smokeless tobacco: Never Used  . Alcohol use No     Comment: former heavy alcohol use   Objective:   There were no vitals taken for this visit. There were no vitals filed for this visit.   Physical Exam      Assessment & Plan:  Mar Daring, PA-C  [facility]

## 2017-09-09 NOTE — Progress Notes (Signed)
Patient: Erik Gibbs Male    DOB: 1935-08-12   82 y.o.   MRN: [id number] Visit Date: 09/09/2017  Today's Provider: Mar Daring, PA-C   Chief Complaint  Patient presents with  . Follow-up    Diabetes   Subjective:    HPI  Diabetes Mellitus Type II, Follow-up:   Lab Results  Component Value Date   HGBA1C 6.7 09/09/2017   HGBA1C 6.5 06/09/2017   HGBA1C 7.3 03/04/2017    Last seen for diabetes 3 months ago.  Management since then includes none. He reports excellent compliance with treatment. He is not having side effects.  Current symptoms include none and have been stable. Home blood sugar records: 120's-110's  Episodes of hypoglycemia? no   Current Insulin Regimen:  Most Recent Eye Exam:not UTD Weight trend: stable Prior visit with dietician: no Current diet: eats everything, small portions Current exercise: none  Pertinent Labs:    Component Value Date/Time   CHOL 118 06/11/2017 0810   TRIG 88 06/11/2017 0810   HDL 46 06/11/2017 0810   LDLCALC 54 06/11/2017 0810   CREATININE 2.01 (H) 06/11/2017 0810    Wt Readings from Last 3 Encounters:  09/09/17 210 lb 12.8 oz (95.6 kg)  06/09/17 207 lb 6.4 oz (94.1 kg)  03/04/17 211 lb (95.7 kg)    ------------------------------------------------------------------------     Allergies  Allergen Reactions  . Amlodipine     BP drops severly  . Nitrofuran Derivatives   . Povidone-Iodine      Current Outpatient Prescriptions:  .  aspirin 81 MG tablet, Take 81 mg by mouth daily.  , Disp: , Rfl:  .  budesonide-formoterol (SYMBICORT) 160-4.5 MCG/ACT inhaler, Inhale 2 puffs into the lungs 2 (two) times daily., Disp: 3 Inhaler, Rfl: 3 .  cilostazol (PLETAL) 100 MG tablet, Take 1 tablet (100 mg total) by mouth 2 (two) times daily., Disp: 180 tablet, Rfl: 3 .  docusate sodium (COLACE) 50 MG capsule, DOCUSATE SODIUM, 50MG  (Oral Capsule)  one every day for 0 days  Quantity: 0.00;  Refills: 0   Ordered  :14-Jan-2011  Teena Dunk ;  Started 26-Jun-2009 Active, Disp: , Rfl:  .  famotidine (PEPCID) 20 MG tablet, Take 1 tablet (20 mg total) by mouth 2 (two) times daily., Disp: 180 tablet, Rfl: 3 .  FREESTYLE LITE test strip, USE ONE STRIP TO CHECK GLUCOSE TWICE DAILY AND AS NEEDED, Disp: 200 each, Rfl: 3 .  gabapentin (NEURONTIN) 300 MG capsule, Take 1 capsule (300 mg total) by mouth at bedtime., Disp: 90 capsule, Rfl: 3 .  insulin NPH-insulin regular (NOVOLIN 70/30) (70-30) 100 UNIT/ML injection, Inject into the skin as directed.  , Disp: , Rfl:  .  ipratropium-albuterol (DUONEB) 0.5-2.5 (3) MG/3ML SOLN, Take 3 mLs by nebulization every 4 (four) hours as needed., Disp: 1080 mL, Rfl: 3 .  iron polysaccharides (NIFEREX) 150 MG capsule, Take 150 mg by mouth 3 (three) times daily., Disp: , Rfl:  .  Lancets (FREESTYLE) lancets, To check blood sugar twice daily, Disp: 200 each, Rfl: 5 .  lisinopril (PRINIVIL,ZESTRIL) 10 MG tablet, TAKE ONE TABLET BY MOUTH ONCE DAILY, Disp: 90 tablet, Rfl: 3 .  loratadine (CLARITIN) 10 MG tablet, Take 10 mg by mouth daily.  , Disp: , Rfl:  .  montelukast (SINGULAIR) 10 MG tablet, TAKE ONE TABLET BY MOUTH AT BEDTIME, Disp: 90 tablet, Rfl: 3 .  niacin 500 MG CR capsule, NIACIN, 500MG  (Oral Capsule Extended Release)  1  Every Day for 0 days  Quantity: 0.00;  Refills: 0   Ordered :08-Oct-2010  [name] ;  Started 28-Mar-2009 Active, Disp: , Rfl:  .  Omega-3 Fatty Acids (FISH OIL) 1200 MG CAPS, Take by mouth daily.  , Disp: , Rfl:  .  pyridOXINE (VITAMIN B-6) 100 MG tablet, Take 100 mg by mouth daily.  , Disp: , Rfl:  .  simvastatin (ZOCOR) 20 MG tablet, Take 1 tablet (20 mg total) by mouth daily., Disp: 90 tablet, Rfl: 3  Review of Systems  Constitutional: Negative for fatigue.  Eyes: Negative for visual disturbance.  Respiratory: Negative for cough, chest tightness and shortness of breath.   Cardiovascular: Negative for chest pain, palpitations and leg swelling.    Endocrine: Negative for cold intolerance, heat intolerance, polydipsia, polyphagia and polyuria.  Neurological: Negative for dizziness, facial asymmetry, light-headedness and headaches.    Social History  Substance Use Topics  . Smoking status: Former Smoker    Packs/day: 3.00    Years: 62.00    Types: Cigarettes    Quit date: 08/08/2010  . Smokeless tobacco: Never Used  . Alcohol use No     Comment: former heavy alcohol use   Objective:   BP 120/68 (BP Location: Left Arm, Patient Position: Sitting, Cuff Size: Large)   Pulse 85   Temp 98.5 F (36.9 C) (Oral)   Resp 18   Wt 210 lb 12.8 oz (95.6 kg)   SpO2 95%   BMI 32.05 kg/m     Physical Exam  Constitutional: He appears well-developed and well-nourished. No distress.  HENT:  Head: Normocephalic and atraumatic.  Neck: Normal range of motion. Neck supple. No JVD present. No tracheal deviation present. No thyromegaly present.  Cardiovascular: Normal rate, regular rhythm and normal heart sounds.  Exam reveals no gallop and no friction rub.   No murmur heard. Pulmonary/Chest: Effort normal. No respiratory distress. He has wheezes (expitratory throughout; stable). He has no rales.  Musculoskeletal: He exhibits edema (1+ pitting edema).  Lymphadenopathy:    He has no cervical adenopathy.  Skin: He is not diaphoretic.  Vitals reviewed.       Assessment & Plan:     1. Type 2 diabetes mellitus with diabetic nephropathy, with long-term current use of insulin (HCC) A1c stable at 6.7. Continue Insulin dosing as prescribed.  - POCT glycosylated hemoglobin (Hb A1C)  2. Influenza vaccine needed Flu vaccine given today without complication. Patient sat upright for 15 minutes to check for adverse reaction before being released. - Flu vaccine HIGH DOSE PF       Mar Daring, PA-C  [facility]

## 2017-10-04 ENCOUNTER — Telehealth: Payer: Self-pay | Admitting: Physician Assistant

## 2017-10-17 ENCOUNTER — Other Ambulatory Visit: Payer: Self-pay | Admitting: Physician Assistant

## 2017-10-17 DIAGNOSIS — E1121 Type 2 diabetes mellitus with diabetic nephropathy: Secondary | ICD-10-CM

## 2017-10-17 DIAGNOSIS — Z794 Long term (current) use of insulin: Principal | ICD-10-CM

## 2017-10-20 DIAGNOSIS — D414 Neoplasm of uncertain behavior of bladder: Secondary | ICD-10-CM | POA: Diagnosis not present

## 2017-10-20 DIAGNOSIS — C689 Malignant neoplasm of urinary organ, unspecified: Secondary | ICD-10-CM | POA: Diagnosis not present

## 2017-10-20 DIAGNOSIS — Z8551 Personal history of malignant neoplasm of bladder: Secondary | ICD-10-CM | POA: Diagnosis not present

## 2017-10-20 DIAGNOSIS — Z8546 Personal history of malignant neoplasm of prostate: Secondary | ICD-10-CM | POA: Diagnosis not present

## 2017-10-20 DIAGNOSIS — C61 Malignant neoplasm of prostate: Secondary | ICD-10-CM | POA: Diagnosis not present

## 2017-11-04 DIAGNOSIS — I10 Essential (primary) hypertension: Secondary | ICD-10-CM | POA: Diagnosis not present

## 2017-11-04 DIAGNOSIS — E1122 Type 2 diabetes mellitus with diabetic chronic kidney disease: Secondary | ICD-10-CM | POA: Diagnosis not present

## 2017-11-04 DIAGNOSIS — N183 Chronic kidney disease, stage 3 (moderate): Secondary | ICD-10-CM | POA: Diagnosis not present

## 2017-11-04 DIAGNOSIS — E1129 Type 2 diabetes mellitus with other diabetic kidney complication: Secondary | ICD-10-CM | POA: Diagnosis not present

## 2017-11-04 DIAGNOSIS — R600 Localized edema: Secondary | ICD-10-CM | POA: Diagnosis not present

## 2017-11-27 ENCOUNTER — Other Ambulatory Visit: Payer: Self-pay | Admitting: Physician Assistant

## 2017-11-27 DIAGNOSIS — N183 Chronic kidney disease, stage 3 unspecified: Secondary | ICD-10-CM

## 2017-11-27 DIAGNOSIS — J449 Chronic obstructive pulmonary disease, unspecified: Secondary | ICD-10-CM

## 2017-11-27 DIAGNOSIS — I129 Hypertensive chronic kidney disease with stage 1 through stage 4 chronic kidney disease, or unspecified chronic kidney disease: Secondary | ICD-10-CM

## 2017-11-29 MED ORDER — LISINOPRIL 10 MG PO TABS: 10.0000 mg | ORAL_TABLET | Freq: Every day | ORAL | 3 refills | Status: DC

## 2017-11-29 MED ORDER — MONTELUKAST SODIUM 10 MG PO TABS: 10.0000 mg | ORAL_TABLET | Freq: Every day | ORAL | 3 refills | Status: DC

## 2017-11-29 NOTE — Addendum Note (Signed)
Addended by: Mar Daring on: 11/29/2017 08:24 AM   Modules accepted: Orders

## 2017-12-01 DIAGNOSIS — C670 Malignant neoplasm of trigone of bladder: Secondary | ICD-10-CM | POA: Diagnosis not present

## 2017-12-16 ENCOUNTER — Ambulatory Visit (INDEPENDENT_AMBULATORY_CARE_PROVIDER_SITE_OTHER): Payer: PPO | Admitting: Physician Assistant

## 2017-12-16 ENCOUNTER — Encounter: Payer: Self-pay | Admitting: Physician Assistant

## 2017-12-16 VITALS — BP 132/64 | HR 82 | Temp 99.0°F | Resp 16 | Ht 68.0 in | Wt 206.0 lb

## 2017-12-16 DIAGNOSIS — J418 Mixed simple and mucopurulent chronic bronchitis: Secondary | ICD-10-CM

## 2017-12-16 DIAGNOSIS — E1121 Type 2 diabetes mellitus with diabetic nephropathy: Secondary | ICD-10-CM

## 2017-12-16 DIAGNOSIS — Z794 Long term (current) use of insulin: Secondary | ICD-10-CM | POA: Diagnosis not present

## 2017-12-16 DIAGNOSIS — D090 Carcinoma in situ of bladder: Secondary | ICD-10-CM

## 2017-12-16 DIAGNOSIS — J449 Chronic obstructive pulmonary disease, unspecified: Secondary | ICD-10-CM | POA: Insufficient documentation

## 2017-12-16 LAB — POCT GLYCOSYLATED HEMOGLOBIN (HGB A1C): Hemoglobin A1C: 6.6

## 2017-12-16 NOTE — Progress Notes (Signed)
Patient: Erik Gibbs Male    DOB: 1935/01/12   83 y.o.   MRN: [id number] Visit Date: 12/16/2017  Today's Provider: Mar Daring, PA-C   Chief Complaint  Patient presents with  . Diabetes   Subjective:    HPI  Diabetes Mellitus Type II, Follow-up:   Lab Results  Component Value Date   HGBA1C 6.6 12/16/2017   HGBA1C 6.7 09/09/2017   HGBA1C 6.5 06/09/2017    Last seen for diabetes 3 months ago.  Management since then includes no changes. He reports good compliance with treatment. He is not having side effects.  Current symptoms include none and have been stable. Home blood sugar records: 120s-160s  Episodes of hypoglycemia? no   Current Insulin Regimen: none Most Recent Eye Exam: due in July this year.  Weight trend: stable Prior visit with dietician: no Current diet: well balanced Current exercise: no regular exercise  Pertinent Labs:    Component Value Date/Time   CHOL 118 06/11/2017 0810   TRIG 88 06/11/2017 0810   HDL 46 06/11/2017 0810   LDLCALC 54 06/11/2017 0810   CREATININE 2.01 (H) 06/11/2017 0810    Wt Readings from Last 3 Encounters:  12/16/17 206 lb (93.4 kg)  09/09/17 210 lb 12.8 oz (95.6 kg)  06/09/17 207 lb 6.4 oz (94.1 kg)          Allergies  Allergen Reactions  . Amlodipine     BP drops severly  . Nitrofuran Derivatives   . Povidone-Iodine      Current Outpatient Medications:  .  aspirin 81 MG tablet, Take 81 mg by mouth daily.  , Disp: , Rfl:  .  budesonide-formoterol (SYMBICORT) 160-4.5 MCG/ACT inhaler, Inhale 2 puffs into the lungs 2 (two) times daily., Disp: 3 Inhaler, Rfl: 3 .  cilostazol (PLETAL) 100 MG tablet, Take 1 tablet (100 mg total) by mouth 2 (two) times daily., Disp: 180 tablet, Rfl: 3 .  docusate sodium (COLACE) 50 MG capsule, DOCUSATE SODIUM, 50MG  (Oral Capsule)  one every day for 0 days  Quantity: 0.00;  Refills: 0   Ordered :14-Jan-2011  Teena Dunk ;  Started 26-Jun-2009 Active, Disp: ,  Rfl:  .  famotidine (PEPCID) 20 MG tablet, Take 1 tablet (20 mg total) by mouth 2 (two) times daily., Disp: 180 tablet, Rfl: 3 .  FREESTYLE LITE test strip, USE ONE STRIP TO CHECK GLUCOSE TWICE DAILY AND AS NEEDED, Disp: 100 each, Rfl: 7 .  gabapentin (NEURONTIN) 300 MG capsule, Take 1 capsule (300 mg total) by mouth at bedtime., Disp: 90 capsule, Rfl: 3 .  insulin NPH-insulin regular (NOVOLIN 70/30) (70-30) 100 UNIT/ML injection, Inject into the skin as directed.  , Disp: , Rfl:  .  ipratropium-albuterol (DUONEB) 0.5-2.5 (3) MG/3ML SOLN, Take 3 mLs by nebulization every 4 (four) hours as needed., Disp: 1080 mL, Rfl: 3 .  iron polysaccharides (NIFEREX) 150 MG capsule, Take 150 mg by mouth 3 (three) times daily., Disp: , Rfl:  .  Lancets (FREESTYLE) lancets, To check blood sugar twice daily, Disp: 200 each, Rfl: 5 .  lisinopril (PRINIVIL,ZESTRIL) 10 MG tablet, Take 1 tablet (10 mg total) by mouth daily., Disp: 90 tablet, Rfl: 3 .  loratadine (CLARITIN) 10 MG tablet, Take 10 mg by mouth daily.  , Disp: , Rfl:  .  montelukast (SINGULAIR) 10 MG tablet, Take 1 tablet (10 mg total) by mouth at bedtime., Disp: 90 tablet, Rfl: 3 .  niacin 500 MG CR capsule,  NIACIN, 500MG  (Oral Capsule Extended Release)  1 Every Day for 0 days  Quantity: 0.00;  Refills: 0   Ordered :08-Oct-2010  [name] ;  Started 28-Mar-2009 Active, Disp: , Rfl:  .  Omega-3 Fatty Acids (FISH OIL) 1200 MG CAPS, Take by mouth daily.  , Disp: , Rfl:  .  pyridOXINE (VITAMIN B-6) 100 MG tablet, Take 100 mg by mouth daily.  , Disp: , Rfl:  .  simvastatin (ZOCOR) 20 MG tablet, Take 1 tablet (20 mg total) by mouth daily., Disp: 90 tablet, Rfl: 3  Review of Systems  Constitutional: Negative.   Respiratory: Negative.   Cardiovascular: Negative.   Endocrine: Negative.   Musculoskeletal: Negative.   Neurological: Negative.     Social History   Tobacco Use  . Smoking status: Former Smoker    Packs/day: 3.00    Years: 62.00    Pack  years: 186.00    Types: Cigarettes    Last attempt to quit: 08/08/2010    Years since quitting: 7.3  . Smokeless tobacco: Never Used  Substance Use Topics  . Alcohol use: No    Comment: former heavy alcohol use   Objective:   BP 132/64 (BP Location: Right Arm, Patient Position: Sitting, Cuff Size: Normal)   Pulse 82   Temp 99 F (37.2 C)   Resp 16   Ht 5\' 8"  (0.109 m)   Wt 206 lb (93.4 kg)   SpO2 99%   BMI 31.32 kg/m  Vitals:   12/16/17 0805  BP: 132/64  Pulse: 82  Resp: 16  Temp: 99 F (37.2 C)  SpO2: 99%  Weight: 206 lb (93.4 kg)  Height: 5\' 8"  (1.727 m)     Physical Exam  Constitutional: He appears well-developed and well-nourished. No distress.  HENT:  Head: Normocephalic and atraumatic.  Neck: Normal range of motion. Neck supple. No JVD present. No tracheal deviation present. No thyromegaly present.  Cardiovascular: Normal rate, regular rhythm and normal heart sounds. Exam reveals no gallop and no friction rub.  No murmur heard. Pulmonary/Chest: Effort normal. No respiratory distress. He has decreased breath sounds in the right middle field and the left middle field. He has no wheezes. He has rhonchi in the right upper field, the right lower field, the left upper field and the left lower field. He has no rales.  Lymphadenopathy:    He has no cervical adenopathy.  Skin: He is not diaphoretic.  Vitals reviewed.      Assessment & Plan:     1. Type 2 diabetes mellitus with diabetic nephropathy, with long-term current use of insulin (HCC) A1c stable at 6.6. Continue current treatment plan. I will see him back in 3 months for recheck.  - POCT HgB A1C  2. Bladder CA in situ On most recent Cystoscopy with Dr. Jacqlyn Larsen was found to have recurrence of his bladder cancer. Is scheduled for 6 month f/u with Dr. Jacqlyn Larsen. Will need PSA rechecked at next visit.   3. Mixed simple and mucopurulent chronic bronchitis (HCC) Worsening per patient. Continue Symbicort and Duoneb  nebulizer treatments. Will add Spiriva back into regimen. Sample of 2.52mcg respimat given to patient today in the office. If this works well will send in [place], PA-C  [facility]

## 2017-12-17 ENCOUNTER — Other Ambulatory Visit: Payer: Self-pay | Admitting: Physician Assistant

## 2017-12-17 DIAGNOSIS — G629 Polyneuropathy, unspecified: Secondary | ICD-10-CM

## 2017-12-17 NOTE — Telephone Encounter (Signed)
Pharmacy requesting refills. Thanks!  

## 2017-12-20 ENCOUNTER — Other Ambulatory Visit: Payer: Self-pay

## 2017-12-20 DIAGNOSIS — E1121 Type 2 diabetes mellitus with diabetic nephropathy: Secondary | ICD-10-CM

## 2017-12-20 DIAGNOSIS — J449 Chronic obstructive pulmonary disease, unspecified: Secondary | ICD-10-CM

## 2017-12-20 DIAGNOSIS — K219 Gastro-esophageal reflux disease without esophagitis: Secondary | ICD-10-CM

## 2017-12-20 DIAGNOSIS — E7800 Pure hypercholesterolemia, unspecified: Secondary | ICD-10-CM

## 2017-12-20 DIAGNOSIS — G629 Polyneuropathy, unspecified: Secondary | ICD-10-CM

## 2017-12-20 DIAGNOSIS — Z794 Long term (current) use of insulin: Secondary | ICD-10-CM

## 2017-12-20 MED ORDER — IPRATROPIUM-ALBUTEROL 0.5-2.5 (3) MG/3ML IN SOLN: 3.0000 mL | RESPIRATORY_TRACT | 3 refills | Status: DC | PRN

## 2017-12-20 MED ORDER — FAMOTIDINE 20 MG PO TABS: 20.0000 mg | ORAL_TABLET | Freq: Two times a day (BID) | ORAL | 3 refills | Status: DC

## 2017-12-20 MED ORDER — BUDESONIDE-FORMOTEROL FUMARATE 160-4.5 MCG/ACT IN AERO: 2.0000 | INHALATION_SPRAY | Freq: Two times a day (BID) | RESPIRATORY_TRACT | 3 refills | Status: DC

## 2017-12-20 MED ORDER — CILOSTAZOL 100 MG PO TABS: 100.0000 mg | ORAL_TABLET | Freq: Two times a day (BID) | ORAL | 3 refills | Status: DC

## 2017-12-20 MED ORDER — SIMVASTATIN 20 MG PO TABS: 20.0000 mg | ORAL_TABLET | Freq: Every day | ORAL | 3 refills | Status: DC

## 2018-02-24 NOTE — Telephone Encounter (Signed)
complete

## 2018-03-16 NOTE — Progress Notes (Signed)
Patient: Erik Gibbs Male    DOB: 1934-12-24   83 y.o.   MRN: [id number] Visit Date: 03/17/2018  Today's Provider: Mar Daring, PA-C   Chief Complaint  Patient presents with  . Diabetes   Subjective:    Diabetes  He presents for his follow-up diabetic visit. He has type 2 diabetes mellitus. His disease course has been stable. Symptoms are stable.   Lab Results  Component Value Date   HGBA1C 6.7 03/17/2018   HGBA1C 6.6 12/16/2017   HGBA1C 6.7 09/09/2017   Lab Results  Component Value Date   LDLCALC 54 06/11/2017   CREATININE 2.01 (H) 06/11/2017      Allergies  Allergen Reactions  . Amlodipine     BP drops severly  . Nitrofuran Derivatives   . Povidone-Iodine      Current Outpatient Medications:  .  aspirin 81 MG tablet, Take 81 mg by mouth daily.  , Disp: , Rfl:  .  budesonide-formoterol (SYMBICORT) 160-4.5 MCG/ACT inhaler, Inhale 2 puffs into the lungs 2 (two) times daily., Disp: 3 Inhaler, Rfl: 3 .  cilostazol (PLETAL) 100 MG tablet, Take 1 tablet (100 mg total) by mouth 2 (two) times daily., Disp: 180 tablet, Rfl: 3 .  docusate sodium (COLACE) 50 MG capsule, DOCUSATE SODIUM, 50MG  (Oral Capsule)  one every day for 0 days  Quantity: 0.00;  Refills: 0   Ordered :14-Jan-2011  Teena Dunk ;  Started 26-Jun-2009 Active, Disp: , Rfl:  .  famotidine (PEPCID) 20 MG tablet, Take 1 tablet (20 mg total) by mouth 2 (two) times daily., Disp: 180 tablet, Rfl: 3 .  FREESTYLE LITE test strip, USE ONE STRIP TO CHECK GLUCOSE TWICE DAILY AND AS NEEDED, Disp: 100 each, Rfl: 7 .  gabapentin (NEURONTIN) 300 MG capsule, TAKE ONE CAPSULE BY MOUTH AT BEDTIME, Disp: 90 capsule, Rfl: 3 .  insulin NPH-insulin regular (NOVOLIN 70/30) (70-30) 100 UNIT/ML injection, Inject into the skin as directed.  , Disp: , Rfl:  .  ipratropium-albuterol (DUONEB) 0.5-2.5 (3) MG/3ML SOLN, Take 3 mLs by nebulization every 4 (four) hours as needed., Disp: 1080 mL, Rfl: 3 .  iron  polysaccharides (NIFEREX) 150 MG capsule, Take 150 mg by mouth 3 (three) times daily., Disp: , Rfl:  .  Lancets (FREESTYLE) lancets, To check blood sugar twice daily, Disp: 200 each, Rfl: 5 .  lisinopril (PRINIVIL,ZESTRIL) 10 MG tablet, Take 1 tablet (10 mg total) by mouth daily., Disp: 90 tablet, Rfl: 3 .  loratadine (CLARITIN) 10 MG tablet, Take 10 mg by mouth daily.  , Disp: , Rfl:  .  montelukast (SINGULAIR) 10 MG tablet, Take 1 tablet (10 mg total) by mouth at bedtime., Disp: 90 tablet, Rfl: 3 .  niacin 500 MG CR capsule, NIACIN, 500MG  (Oral Capsule Extended Release)  1 Every Day for 0 days  Quantity: 0.00;  Refills: 0   Ordered :08-Oct-2010  [name] ;  Started 28-Mar-2009 Active, Disp: , Rfl:  .  Omega-3 Fatty Acids (FISH OIL) 1200 MG CAPS, Take by mouth daily.  , Disp: , Rfl:  .  pyridOXINE (VITAMIN B-6) 100 MG tablet, Take 100 mg by mouth daily.  , Disp: , Rfl:  .  simvastatin (ZOCOR) 20 MG tablet, Take 1 tablet (20 mg total) by mouth daily., Disp: 90 tablet, Rfl: 3  Review of Systems  Constitutional: Negative.   Respiratory: Negative.   Cardiovascular: Negative.   Neurological: Negative.     Social History  Tobacco Use  . Smoking status: Former Smoker    Packs/day: 3.00    Years: 62.00    Pack years: 186.00    Types: Cigarettes    Last attempt to quit: 08/08/2010    Years since quitting: 7.6  . Smokeless tobacco: Never Used  Substance Use Topics  . Alcohol use: No    Comment: former heavy alcohol use   Objective:   BP 138/70 (BP Location: Right Arm, Patient Position: Sitting, Cuff Size: Large)   Pulse 78   Wt 209 lb (94.8 kg)   SpO2 94%   BMI 31.78 kg/m  Vitals:   03/17/18 0807  BP: 138/70  Pulse: 78  SpO2: 94%  Weight: 209 lb (94.8 kg)     Physical Exam  Constitutional: He appears well-developed and well-nourished. No distress.  HENT:  Head: Normocephalic and atraumatic.  Neck: Normal range of motion. Neck supple. No JVD present. No tracheal  deviation present. No thyromegaly present.  Cardiovascular: Normal rate, regular rhythm and normal heart sounds. Exam reveals no gallop and no friction rub.  No murmur heard. Pulmonary/Chest: Effort normal. No respiratory distress. He has wheezes (expiratory throughout). He has no rales.  Lymphadenopathy:    He has no cervical adenopathy.  Skin: He is not diaphoretic.  Vitals reviewed.      Assessment & Plan:     1. Type 2 diabetes mellitus with diabetic nephropathy, with long-term current use of insulin (HCC) Stable at 6.7. Continue current medications. I will see him back in 3 months for AWV. - POCT glycosylated hemoglobin (Hb A1C)  2. Elevated PSA History of this with known bladder tumors. Will recheck PSA for Dr. Jacqlyn Larsen and Dr. Candiss Norse at patient's request. I will send lab results once received.  - PSA       Mar Daring, PA-C  [facility]

## 2018-03-17 ENCOUNTER — Ambulatory Visit (INDEPENDENT_AMBULATORY_CARE_PROVIDER_SITE_OTHER): Payer: PPO | Admitting: Physician Assistant

## 2018-03-17 ENCOUNTER — Encounter: Payer: Self-pay | Admitting: Physician Assistant

## 2018-03-17 ENCOUNTER — Other Ambulatory Visit: Payer: Self-pay

## 2018-03-17 VITALS — BP 138/70 | HR 78 | Wt 209.0 lb

## 2018-03-17 DIAGNOSIS — Z794 Long term (current) use of insulin: Secondary | ICD-10-CM | POA: Diagnosis not present

## 2018-03-17 DIAGNOSIS — E1121 Type 2 diabetes mellitus with diabetic nephropathy: Secondary | ICD-10-CM

## 2018-03-17 DIAGNOSIS — R972 Elevated prostate specific antigen [PSA]: Secondary | ICD-10-CM | POA: Diagnosis not present

## 2018-03-17 LAB — POCT GLYCOSYLATED HEMOGLOBIN (HGB A1C): Hemoglobin A1C: 6.7

## 2018-03-17 NOTE — Patient Instructions (Signed)

## 2018-03-21 DIAGNOSIS — R972 Elevated prostate specific antigen [PSA]: Secondary | ICD-10-CM | POA: Diagnosis not present

## 2018-03-22 LAB — PSA: PROSTATE SPECIFIC AG, SERUM: 1.7 ng/mL (ref 0.0–4.0)

## 2018-04-27 DIAGNOSIS — C61 Malignant neoplasm of prostate: Secondary | ICD-10-CM | POA: Diagnosis not present

## 2018-04-27 DIAGNOSIS — C670 Malignant neoplasm of trigone of bladder: Secondary | ICD-10-CM | POA: Diagnosis not present

## 2018-04-27 DIAGNOSIS — Z8551 Personal history of malignant neoplasm of bladder: Secondary | ICD-10-CM | POA: Diagnosis not present

## 2018-04-27 DIAGNOSIS — N3941 Urge incontinence: Secondary | ICD-10-CM | POA: Diagnosis not present

## 2018-04-27 DIAGNOSIS — D414 Neoplasm of uncertain behavior of bladder: Secondary | ICD-10-CM | POA: Diagnosis not present

## 2018-04-27 DIAGNOSIS — N393 Stress incontinence (female) (male): Secondary | ICD-10-CM | POA: Diagnosis not present

## 2018-04-27 DIAGNOSIS — N3020 Other chronic cystitis without hematuria: Secondary | ICD-10-CM | POA: Diagnosis not present

## 2018-04-29 DIAGNOSIS — C61 Malignant neoplasm of prostate: Secondary | ICD-10-CM | POA: Diagnosis not present

## 2018-04-29 DIAGNOSIS — C670 Malignant neoplasm of trigone of bladder: Secondary | ICD-10-CM | POA: Diagnosis not present

## 2018-04-29 DIAGNOSIS — Z9079 Acquired absence of other genital organ(s): Secondary | ICD-10-CM | POA: Diagnosis not present

## 2018-04-29 DIAGNOSIS — N281 Cyst of kidney, acquired: Secondary | ICD-10-CM | POA: Diagnosis not present

## 2018-05-10 DIAGNOSIS — E1129 Type 2 diabetes mellitus with other diabetic kidney complication: Secondary | ICD-10-CM | POA: Diagnosis not present

## 2018-05-10 DIAGNOSIS — I10 Essential (primary) hypertension: Secondary | ICD-10-CM | POA: Diagnosis not present

## 2018-05-10 DIAGNOSIS — N183 Chronic kidney disease, stage 3 (moderate): Secondary | ICD-10-CM | POA: Diagnosis not present

## 2018-05-10 DIAGNOSIS — D631 Anemia in chronic kidney disease: Secondary | ICD-10-CM | POA: Diagnosis not present

## 2018-05-11 ENCOUNTER — Telehealth: Payer: Self-pay | Admitting: Physician Assistant

## 2018-05-11 NOTE — Telephone Encounter (Signed)
I called the patient to schedule AWV w/ [name], but Mrs. Erik Gibbs declined. VDM (DD)

## 2018-06-02 DIAGNOSIS — E119 Type 2 diabetes mellitus without complications: Secondary | ICD-10-CM | POA: Diagnosis not present

## 2018-06-02 DIAGNOSIS — C670 Malignant neoplasm of trigone of bladder: Secondary | ICD-10-CM | POA: Diagnosis not present

## 2018-06-02 DIAGNOSIS — Z01818 Encounter for other preprocedural examination: Secondary | ICD-10-CM | POA: Diagnosis not present

## 2018-06-02 DIAGNOSIS — J449 Chronic obstructive pulmonary disease, unspecified: Secondary | ICD-10-CM | POA: Diagnosis not present

## 2018-06-02 DIAGNOSIS — I10 Essential (primary) hypertension: Secondary | ICD-10-CM | POA: Diagnosis not present

## 2018-06-02 DIAGNOSIS — Z79899 Other long term (current) drug therapy: Secondary | ICD-10-CM | POA: Diagnosis not present

## 2018-06-10 DIAGNOSIS — E1140 Type 2 diabetes mellitus with diabetic neuropathy, unspecified: Secondary | ICD-10-CM | POA: Diagnosis not present

## 2018-06-10 DIAGNOSIS — K219 Gastro-esophageal reflux disease without esophagitis: Secondary | ICD-10-CM | POA: Diagnosis not present

## 2018-06-10 DIAGNOSIS — Z7951 Long term (current) use of inhaled steroids: Secondary | ICD-10-CM | POA: Diagnosis not present

## 2018-06-10 DIAGNOSIS — Z8546 Personal history of malignant neoplasm of prostate: Secondary | ICD-10-CM | POA: Diagnosis not present

## 2018-06-10 DIAGNOSIS — D649 Anemia, unspecified: Secondary | ICD-10-CM | POA: Diagnosis not present

## 2018-06-10 DIAGNOSIS — Z87891 Personal history of nicotine dependence: Secondary | ICD-10-CM | POA: Diagnosis not present

## 2018-06-10 DIAGNOSIS — J449 Chronic obstructive pulmonary disease, unspecified: Secondary | ICD-10-CM | POA: Diagnosis not present

## 2018-06-10 DIAGNOSIS — E1122 Type 2 diabetes mellitus with diabetic chronic kidney disease: Secondary | ICD-10-CM | POA: Diagnosis not present

## 2018-06-10 DIAGNOSIS — N183 Chronic kidney disease, stage 3 (moderate): Secondary | ICD-10-CM | POA: Diagnosis not present

## 2018-06-10 DIAGNOSIS — Z8551 Personal history of malignant neoplasm of bladder: Secondary | ICD-10-CM | POA: Diagnosis not present

## 2018-06-10 DIAGNOSIS — Z9079 Acquired absence of other genital organ(s): Secondary | ICD-10-CM | POA: Diagnosis not present

## 2018-06-10 DIAGNOSIS — Z79899 Other long term (current) drug therapy: Secondary | ICD-10-CM | POA: Diagnosis not present

## 2018-06-10 DIAGNOSIS — Z794 Long term (current) use of insulin: Secondary | ICD-10-CM | POA: Diagnosis not present

## 2018-06-10 DIAGNOSIS — I129 Hypertensive chronic kidney disease with stage 1 through stage 4 chronic kidney disease, or unspecified chronic kidney disease: Secondary | ICD-10-CM | POA: Diagnosis not present

## 2018-06-10 DIAGNOSIS — N35919 Unspecified urethral stricture, male, unspecified site: Secondary | ICD-10-CM | POA: Diagnosis not present

## 2018-06-10 DIAGNOSIS — Z7982 Long term (current) use of aspirin: Secondary | ICD-10-CM | POA: Diagnosis not present

## 2018-06-10 DIAGNOSIS — N393 Stress incontinence (female) (male): Secondary | ICD-10-CM | POA: Diagnosis not present

## 2018-06-10 DIAGNOSIS — Z972 Presence of dental prosthetic device (complete) (partial): Secondary | ICD-10-CM | POA: Diagnosis not present

## 2018-06-17 ENCOUNTER — Other Ambulatory Visit: Payer: Self-pay | Admitting: Physician Assistant

## 2018-06-17 DIAGNOSIS — E1121 Type 2 diabetes mellitus with diabetic nephropathy: Secondary | ICD-10-CM

## 2018-06-17 DIAGNOSIS — Z794 Long term (current) use of insulin: Principal | ICD-10-CM

## 2018-06-23 ENCOUNTER — Ambulatory Visit (INDEPENDENT_AMBULATORY_CARE_PROVIDER_SITE_OTHER): Payer: PPO | Admitting: Physician Assistant

## 2018-06-23 ENCOUNTER — Encounter: Payer: Self-pay | Admitting: Physician Assistant

## 2018-06-23 VITALS — BP 140/70 | HR 82 | Temp 98.6°F | Resp 16 | Ht 67.0 in | Wt 208.0 lb

## 2018-06-23 DIAGNOSIS — E1121 Type 2 diabetes mellitus with diabetic nephropathy: Secondary | ICD-10-CM

## 2018-06-23 DIAGNOSIS — Z0000 Encounter for general adult medical examination without abnormal findings: Secondary | ICD-10-CM

## 2018-06-23 DIAGNOSIS — Z794 Long term (current) use of insulin: Secondary | ICD-10-CM | POA: Diagnosis not present

## 2018-06-23 DIAGNOSIS — E782 Mixed hyperlipidemia: Secondary | ICD-10-CM

## 2018-06-23 DIAGNOSIS — I129 Hypertensive chronic kidney disease with stage 1 through stage 4 chronic kidney disease, or unspecified chronic kidney disease: Secondary | ICD-10-CM

## 2018-06-23 DIAGNOSIS — Z8546 Personal history of malignant neoplasm of prostate: Secondary | ICD-10-CM

## 2018-06-23 DIAGNOSIS — Z8551 Personal history of malignant neoplasm of bladder: Secondary | ICD-10-CM

## 2018-06-23 DIAGNOSIS — J449 Chronic obstructive pulmonary disease, unspecified: Secondary | ICD-10-CM

## 2018-06-23 DIAGNOSIS — N183 Chronic kidney disease, stage 3 unspecified: Secondary | ICD-10-CM

## 2018-06-23 DIAGNOSIS — I130 Hypertensive heart and chronic kidney disease with heart failure and stage 1 through stage 4 chronic kidney disease, or unspecified chronic kidney disease: Secondary | ICD-10-CM | POA: Diagnosis not present

## 2018-06-23 LAB — POCT GLYCOSYLATED HEMOGLOBIN (HGB A1C)
Est. average glucose Bld gHb Est-mCnc: 154
HEMOGLOBIN A1C: 7 % — AB (ref 4.0–5.6)

## 2018-06-23 NOTE — Progress Notes (Signed)
Patient: Erik Gibbs, Male    DOB: 15-Jan-1935, 83 y.o.   MRN: [id number] Visit Date: 06/23/2018  Today's Provider: Mar Daring, PA-C   Chief Complaint  Patient presents with  . Medicare Wellness   Subjective:    Annual wellness visit Erik Gibbs is a 83 y.o. male. He feels well. He reports exercising. He reports he is sleeping fairly well.  -----------------------------------------------------------   Review of Systems  Constitutional: Negative.   HENT: Positive for rhinorrhea.   Eyes: Negative.   Respiratory: Positive for cough and wheezing.   Cardiovascular: Negative.   Gastrointestinal: Negative.   Endocrine: Negative.   Genitourinary: Negative.   Musculoskeletal: Positive for arthralgias and myalgias.  Skin: Negative.   Allergic/Immunologic: Positive for environmental allergies.  Neurological: Negative.   Hematological: Negative.   Psychiatric/Behavioral: Negative.     Social History   Socioeconomic History  . Marital status: Married    Spouse name: Erik Gibbs  . Number of children: 4  . Years of education: 6th Grade  . Highest education level: Not on file  Occupational History  . Occupation: Retired  Scientific laboratory technician  . Financial resource strain: Not on file  . Food insecurity:    Worry: Not on file    Inability: Not on file  . Transportation needs:    Medical: Not on file    Non-medical: Not on file  Tobacco Use  . Smoking status: Former Smoker    Packs/day: 3.00    Years: 62.00    Pack years: 186.00    Types: Cigarettes    Last attempt to quit: 08/08/2010    Years since quitting: 7.8  . Smokeless tobacco: Never Used  Substance and Sexual Activity  . Alcohol use: No    Comment: former heavy alcohol use  . Drug use: No  . Sexual activity: Never  Lifestyle  . Physical activity:    Days per week: Not on file    Minutes per session: Not on file  . Stress: Not on file  Relationships  . Social connections:    Talks on phone: Not  on file    Gets together: Not on file    Attends religious service: Not on file    Active member of club or organization: Not on file    Attends meetings of clubs or organizations: Not on file    Relationship status: Not on file  . Intimate partner violence:    Fear of current or ex partner: Not on file    Emotionally abused: Not on file    Physically abused: Not on file    Forced sexual activity: Not on file  Other Topics Concern  . Not on file  Social History Narrative   Married   Gets regular exercise    Past Medical History:  Diagnosis Date  . Anemia   . COPD (chronic obstructive pulmonary disease) ([place])   . Diabetes mellitus   . GERD (gastroesophageal reflux disease)   . Hyperlipidemia, mixed   . Hypertension   . PVD (peripheral vascular disease) [facility])      Patient Active Problem List   Diagnosis Date Noted  . COPD (chronic obstructive pulmonary disease) ([place]) 12/16/2017  . Neuropathy 06/10/2015  . Diabetes mellitus with diabetic nephropathy ([place]) 06/10/2015  . Senile purpura ([place]) 06/10/2015  . Anemia, iron deficiency 04/09/2015  . Adult BMI 30+ 04/09/2015  . Chronic kidney disease (CKD), stage III (moderate) ([place]) 04/09/2015  . Gastric ulcer 04/09/2015  .  Rash and nonspecific skin eruption 09/05/2014  . Bladder CA in situ 12/13/2012  . TOBACCO ABUSE 11/05/2009  . Benign hypertension with CKD (chronic kidney disease) stage III ([place]) 04/18/2009  . PVD 04/18/2009  . GERD 04/18/2009  . Combined fat and carbohydrate induced hyperlipemia 09/28/2008  . Allergic rhinitis 12/26/2007  . CAFL (chronic airflow limitation) ([place]) 12/26/2007  . H/O primary malignant neoplasm of urinary bladder 12/26/2007  . History of colon polyps 12/26/2007  . H/O malignant neoplasm of prostate 12/26/2007  . Heart & renal disease, hypertensive, with heart failure ([place]) 12/26/2007  . Angiopathy, peripheral ([place]) 12/26/2007    Past Surgical History:  Procedure Laterality Date  .  BLADDER TUMOR EXCISION  2007  . PROSTATECTOMY  1995  . TESTICLE REMOVAL Bilateral    Dr. August Saucer    His family history includes Asthma in his son; COPD in his brother; Cancer in his father; Depression in his mother; Diabetes in his brother and other; Emphysema in his father; Heart attack in his brother; Heart disease in his mother; Heart failure in his other; Ulcers in his mother.      Current Outpatient Medications:  .  aspirin 81 MG tablet, Take 81 mg by mouth daily.  , Disp: , Rfl:  .  budesonide-formoterol (SYMBICORT) 160-4.5 MCG/ACT inhaler, Inhale 2 puffs into the lungs 2 (two) times daily., Disp: 3 Inhaler, Rfl: 3 .  cilostazol (PLETAL) 100 MG tablet, Take 1 tablet (100 mg total) by mouth 2 (two) times daily., Disp: 180 tablet, Rfl: 3 .  Cyanocobalamin (B-12 PO), Take 1,000 mcg by mouth., Disp: , Rfl:  .  docusate sodium (COLACE) 50 MG capsule, DOCUSATE SODIUM, 50MG  (Oral Capsule)  one every day for 0 days  Quantity: 0.00;  Refills: 0   Ordered :14-Jan-2011  Teena Dunk ;  Started 26-Jun-2009 Active, Disp: , Rfl:  .  famotidine (PEPCID) 20 MG tablet, Take 1 tablet (20 mg total) by mouth 2 (two) times daily., Disp: 180 tablet, Rfl: 3 .  FREESTYLE LITE test strip, USE ONE STRIP TO CHECK GLUCOSE TWICE DAILY AND AS NEEDED, Disp: 100 each, Rfl: 7 .  gabapentin (NEURONTIN) 300 MG capsule, TAKE ONE CAPSULE BY MOUTH AT BEDTIME, Disp: 90 capsule, Rfl: 3 .  insulin NPH-insulin regular (NOVOLIN 70/30) (70-30) 100 UNIT/ML injection, Inject into the skin as directed.  , Disp: , Rfl:  .  ipratropium-albuterol (DUONEB) 0.5-2.5 (3) MG/3ML SOLN, Take 3 mLs by nebulization every 4 (four) hours as needed., Disp: 1080 mL, Rfl: 3 .  iron polysaccharides (NIFEREX) 150 MG capsule, Take 150 mg by mouth 3 (three) times daily., Disp: , Rfl:  .  Lancets (FREESTYLE) lancets, USE 1  TO CHECK GLUCOSE TWICE DAILY, Disp: 200 each, Rfl: 5 .  lisinopril (PRINIVIL,ZESTRIL) 10 MG tablet, Take 1 tablet (10 mg total) by  mouth daily., Disp: 90 tablet, Rfl: 3 .  loratadine (CLARITIN) 10 MG tablet, Take 10 mg by mouth daily.  , Disp: , Rfl:  .  montelukast (SINGULAIR) 10 MG tablet, Take 1 tablet (10 mg total) by mouth at bedtime., Disp: 90 tablet, Rfl: 3 .  niacin 500 MG CR capsule, NIACIN, 500MG  (Oral Capsule Extended Release)  1 Every Day for 0 days  Quantity: 0.00;  Refills: 0   Ordered :08-Oct-2010  [name] ;  Started 28-Mar-2009 Active, Disp: , Rfl:  .  Omega-3 Fatty Acids (FISH OIL) 1200 MG CAPS, Take by mouth daily.  , Disp: , Rfl:  .  pyridOXINE (VITAMIN B-6) 100  MG tablet, Take 100 mg by mouth daily.  , Disp: , Rfl:  .  simvastatin (ZOCOR) 20 MG tablet, Take 1 tablet (20 mg total) by mouth daily., Disp: 90 tablet, Rfl: 3 .  torsemide (DEMADEX) 10 MG tablet, TAKE 1 TABLET BY MOUTH IN THE MORNING AS NEEDED, Disp: , Rfl: 6  Patient Care Team: Mar Daring, PA-C as PCP - General (Family Medicine)     Objective:   Vitals: BP 140/70 (BP Location: Right Arm, Patient Position: Sitting, Cuff Size: Normal)   Pulse 82   Temp 98.6 F (37 C) (Oral)   Resp 16   Ht 5\' 7"  (1.702 m)   Wt 208 lb (94.3 kg)   SpO2 97%   BMI 32.58 kg/m   Physical Exam  Constitutional: He is oriented to person, place, and time. He appears well-developed and well-nourished.  HENT:  Head: Normocephalic and atraumatic.  Right Ear: Hearing, tympanic membrane, external ear and ear canal normal.  Left Ear: Hearing, tympanic membrane, external ear and ear canal normal.  Nose: Nose normal.  Mouth/Throat: Uvula is midline, oropharynx is clear and moist and mucous membranes are normal.  Eyes: Pupils are equal, round, and reactive to light. Conjunctivae and EOM are normal. Right eye exhibits no discharge.  Neck: Normal range of motion. Neck supple. Carotid bruit is not present. No tracheal deviation present. No thyromegaly present.  Cardiovascular: Normal rate, regular rhythm, normal heart sounds and intact distal pulses.    No murmur heard. Pulmonary/Chest: Effort normal. No respiratory distress. He has wheezes (mild wheezes heard expiratory throughout). He has no rales. He exhibits no tenderness.  Abdominal: Soft. Bowel sounds are normal. He exhibits no distension and no mass. There is no tenderness. There is no rebound and no guarding.  Musculoskeletal: Normal range of motion. He exhibits no edema or tenderness.  Lymphadenopathy:    He has no cervical adenopathy.  Neurological: He is alert and oriented to person, place, and time. He has normal reflexes. He displays normal reflexes. No cranial nerve deficit. He exhibits normal muscle tone. Coordination normal.  Skin: Skin is warm and dry. No rash noted. No erythema.  Psychiatric: He has a normal mood and affect. His behavior is normal. Judgment and thought content normal.  Vitals reviewed.   Activities of Daily Living In your present state of health, do you have any difficulty performing the following activities: 06/23/2018 12/16/2017  Hearing? N N  Vision? N N  Difficulty concentrating or making decisions? N N  Walking or climbing stairs? N N  Dressing or bathing? N N  Doing errands, shopping? N N  Some recent data might be hidden    Fall Risk Assessment Fall Risk  06/23/2018 12/16/2017 12/02/2016 06/10/2015  Falls in the past year? No No No No     Depression Screen PHQ 2/9 Scores 06/23/2018 12/16/2017 12/02/2016 06/10/2015  PHQ - 2 Score 0 0 0 0     Assessment & Plan:     Annual Wellness Visit  Reviewed patient's Family Medical History Reviewed and updated list of patient's medical providers Assessment of cognitive impairment was done Assessed patient's functional ability Established a written schedule for health screening [place] Completed and Reviewed  Exercise Activities and Dietary recommendations Goals    . Exercise 150 minutes per week (moderate activity)       Immunization History  Administered Date(s)  Administered  . Influenza, High Dose Seasonal PF 08/12/2015, 09/09/2017  . Influenza,inj,Quad PF,6+ Mos 08/08/2014  .  Influenza-Unspecified 07/23/2016  . Pneumococcal Conjugate-13 09/05/2014  . Pneumococcal Polysaccharide-23 11/16/2007, 07/23/2016  . Pneumococcal-Unspecified 08/09/2011  . Tdap 02/22/2012    Health Maintenance  Topic Date Due  . OPHTHALMOLOGY EXAM  06/18/2017  . FOOT EXAM  03/04/2018  . INFLUENZA VACCINE  06/23/2018  . HEMOGLOBIN A1C  09/16/2018  . TETANUS/TDAP  02/21/2022  . PNA vac Low Risk Adult  Completed     Discussed health benefits of physical activity, and encouraged him to engage in regular exercise appropriate for his age and condition.    1. Medicare annual wellness visit, subsequent Normal physical exam today. Up to date on all vaccinations.   2. Type 2 diabetes mellitus with diabetic nephropathy, with long-term current use of insulin (HCC) A1c at 7.0. Continue Novolin 70/30. Will check labs as below and f/u pending results. - POCT glycosylated hemoglobin (Hb A1C) - CBC w/Diff/Platelet - Comprehensive Metabolic Panel (CMET) - Lipid Profile - Fe+TIBC+Fer  3. Benign hypertension with CKD (chronic kidney disease) stage III (HCC) Doing well and stable. Continue Lisinopril 10mg  and torsemide 10mg . Will check labs as below and f/u pending results. - CBC w/Diff/Platelet - Comprehensive Metabolic Panel (CMET) - Lipid Profile - Fe+TIBC+Fer  4. Heart & renal disease, hypertensive, with heart failure (HCC) Stable. See above medical treatment plan. - CBC w/Diff/Platelet - Comprehensive Metabolic Panel (CMET) - Lipid Profile - Fe+TIBC+Fer  5. CAFL (chronic airflow limitation) (HCC) Doing well today. Continue Symbicort inhaler BID. Will check labs as below and f/u pending results. - CBC w/Diff/Platelet - Comprehensive Metabolic Panel (CMET) - Lipid Profile - Fe+TIBC+Fer  6. Chronic kidney disease (CKD), stage III (moderate) (HCC) Stable. Followed  by Dr. Holley Raring, [facility]. Will check labs as below and f/u pending results. - CBC w/Diff/Platelet - Comprehensive Metabolic Panel (CMET) - Lipid Profile - Fe+TIBC+Fer  7. Combined fat and carbohydrate induced hyperlipemia Stable. Continue Simvastatin 20mg . Will check labs as below and f/u pending results. - CBC w/Diff/Platelet - Comprehensive Metabolic Panel (CMET) - Lipid Profile - Fe+TIBC+Fer  8. H/O malignant neoplasm of prostate Was followed by Dr. Jacqlyn Larsen. Has had increased PSA readings recently. Will refer to [facility] as they were not satisfied with the care given by the physician that saw them following Dr. Jacqlyn Larsen leaving. States "he never spoke to [place]."  - CBC w/Diff/Platelet - Comprehensive Metabolic Panel (CMET) - Lipid Profile - Fe+TIBC+Fer - Ambulatory referral to Urology  9. H/O primary malignant neoplasm of urinary bladder Most recent cysto by Dr. Jacqlyn Larsen there was noted to be a new lesion in bladder that needs to be watched. Referral placed to [facility].  - CBC w/Diff/Platelet - Comprehensive Metabolic Panel (CMET) - Lipid Profile - Fe+TIBC+Fer - Ambulatory referral to Urology  ------------------------------------------------------------------------------------------------------------    Mar Daring, PA-C  [facility]

## 2018-06-23 NOTE — Patient Instructions (Signed)

## 2018-06-29 DIAGNOSIS — N183 Chronic kidney disease, stage 3 (moderate): Secondary | ICD-10-CM | POA: Diagnosis not present

## 2018-06-29 DIAGNOSIS — Z794 Long term (current) use of insulin: Secondary | ICD-10-CM | POA: Diagnosis not present

## 2018-06-29 DIAGNOSIS — I129 Hypertensive chronic kidney disease with stage 1 through stage 4 chronic kidney disease, or unspecified chronic kidney disease: Secondary | ICD-10-CM | POA: Diagnosis not present

## 2018-06-29 DIAGNOSIS — I130 Hypertensive heart and chronic kidney disease with heart failure and stage 1 through stage 4 chronic kidney disease, or unspecified chronic kidney disease: Secondary | ICD-10-CM | POA: Diagnosis not present

## 2018-06-29 DIAGNOSIS — Z8551 Personal history of malignant neoplasm of bladder: Secondary | ICD-10-CM | POA: Diagnosis not present

## 2018-06-29 DIAGNOSIS — E782 Mixed hyperlipidemia: Secondary | ICD-10-CM | POA: Diagnosis not present

## 2018-06-29 DIAGNOSIS — Z8546 Personal history of malignant neoplasm of prostate: Secondary | ICD-10-CM | POA: Diagnosis not present

## 2018-06-29 DIAGNOSIS — J449 Chronic obstructive pulmonary disease, unspecified: Secondary | ICD-10-CM | POA: Diagnosis not present

## 2018-06-29 DIAGNOSIS — E1121 Type 2 diabetes mellitus with diabetic nephropathy: Secondary | ICD-10-CM | POA: Diagnosis not present

## 2018-06-30 ENCOUNTER — Telehealth: Payer: Self-pay

## 2018-06-30 LAB — COMPREHENSIVE METABOLIC PANEL
ALT: 11 IU/L (ref 0–44)
AST: 9 IU/L (ref 0–40)
Albumin/Globulin Ratio: 1.2 (ref 1.2–2.2)
Albumin: 3.8 g/dL (ref 3.5–4.7)
Alkaline Phosphatase: 47 IU/L (ref 39–117)
BUN/Creatinine Ratio: 17 (ref 10–24)
BUN: 51 mg/dL — ABNORMAL HIGH (ref 8–27)
Bilirubin Total: <0.2 mg/dL (ref 0.0–1.2)
CO2: 22 mmol/L (ref 20–29)
Calcium: 9.4 mg/dL (ref 8.6–10.2)
Chloride: 106 mmol/L (ref 96–106)
Creatinine, Ser: 3 mg/dL — ABNORMAL HIGH (ref 0.76–1.27)
GFR calc Af Amer: 22 mL/min/{1.73_m2} — ABNORMAL LOW (ref >59)
GFR calc non Af Amer: 19 mL/min/{1.73_m2} — ABNORMAL LOW (ref >59)
Globulin, Total: 3.3 g/dL (ref 1.5–4.5)
Glucose: 151 mg/dL — ABNORMAL HIGH (ref 65–99)
Potassium: 5.9 mmol/L — ABNORMAL HIGH (ref 3.5–5.2)
Sodium: 143 mmol/L (ref 134–144)
Total Protein: 7.1 g/dL (ref 6.0–8.5)

## 2018-06-30 LAB — CBC WITH DIFFERENTIAL/PLATELET
BASOS: 0 %
Basophils Absolute: 0 10*3/uL (ref 0.0–0.2)
EOS (ABSOLUTE): 0.3 10*3/uL (ref 0.0–0.4)
Eos: 5 %
Hematocrit: 28 % — ABNORMAL LOW (ref 37.5–51.0)
Hemoglobin: 9 g/dL — ABNORMAL LOW (ref 13.0–17.7)
IMMATURE GRANULOCYTES: 1 %
Immature Grans (Abs): 0 10*3/uL (ref 0.0–0.1)
Lymphocytes Absolute: 1.3 10*3/uL (ref 0.7–3.1)
Lymphs: 24 %
MCH: 32.1 pg (ref 26.6–33.0)
MCHC: 32.1 g/dL (ref 31.5–35.7)
MCV: 100 fL — ABNORMAL HIGH (ref 79–97)
MONOS ABS: 0.4 10*3/uL (ref 0.1–0.9)
Monocytes: 8 %
NEUTROS PCT: 62 %
Neutrophils Absolute: 3.4 10*3/uL (ref 1.4–7.0)
PLATELETS: 262 10*3/uL (ref 150–450)
RBC: 2.8 x10E6/uL — ABNORMAL LOW (ref 4.14–5.80)
RDW: 13.9 % (ref 12.3–15.4)
WBC: 5.4 10*3/uL (ref 3.4–10.8)

## 2018-06-30 LAB — IRON,TIBC AND FERRITIN PANEL
Ferritin: 28 ng/mL — ABNORMAL LOW (ref 30–400)
Iron Saturation: 17 % (ref 15–55)
Iron: 59 ug/dL (ref 38–169)
TIBC: 343 ug/dL (ref 250–450)
UIBC: 284 ug/dL (ref 111–343)

## 2018-06-30 LAB — LIPID PANEL
Chol/HDL Ratio: 2.9 ratio (ref 0.0–5.0)
Cholesterol, Total: 121 mg/dL (ref 100–199)
HDL: 42 mg/dL (ref >39)
LDL Calculated: 59 mg/dL (ref 0–99)
Triglycerides: 101 mg/dL (ref 0–149)
VLDL Cholesterol Cal: 20 mg/dL (ref 5–40)

## 2018-06-30 NOTE — Telephone Encounter (Signed)
Patient advised as below. Labs faxed to Dr. Candiss Norse nephrologist.

## 2018-06-30 NOTE — Telephone Encounter (Signed)
-----   Message from Mar Daring, PA-C sent at 06/30/2018  8:35 AM EDT ----- Hemoglobin dropped just slightly from 9.4 to 9.0. Kidney function has decreased quite drastically from 2.01 to 3.00. Potassium is also elevated. Hold any potassium supplement if taking or multi-vitamin. Cholesterol is normal. Ferritin is slightly down from last year as well. I will send labs to Dr. Holley Raring.

## 2018-07-01 ENCOUNTER — Other Ambulatory Visit: Payer: Self-pay | Admitting: Nephrology

## 2018-07-01 DIAGNOSIS — N183 Chronic kidney disease, stage 3 unspecified: Secondary | ICD-10-CM

## 2018-07-04 DIAGNOSIS — D631 Anemia in chronic kidney disease: Secondary | ICD-10-CM | POA: Diagnosis not present

## 2018-07-04 DIAGNOSIS — N183 Chronic kidney disease, stage 3 (moderate): Secondary | ICD-10-CM | POA: Diagnosis not present

## 2018-07-04 DIAGNOSIS — E1129 Type 2 diabetes mellitus with other diabetic kidney complication: Secondary | ICD-10-CM | POA: Diagnosis not present

## 2018-07-04 DIAGNOSIS — I10 Essential (primary) hypertension: Secondary | ICD-10-CM | POA: Diagnosis not present

## 2018-07-07 ENCOUNTER — Ambulatory Visit
Admission: RE | Admit: 2018-07-07 | Discharge: 2018-07-07 | Disposition: A | Discharge status: Home / Self Care | Payer: PPO | Source: Ambulatory Visit | Attending: Nephrology | Admitting: Nephrology

## 2018-07-07 DIAGNOSIS — N261 Atrophy of kidney (terminal): Secondary | ICD-10-CM | POA: Diagnosis not present

## 2018-07-07 DIAGNOSIS — N183 Chronic kidney disease, stage 3 unspecified: Secondary | ICD-10-CM

## 2018-08-02 ENCOUNTER — Encounter: Payer: Self-pay | Admitting: Urology

## 2018-08-02 ENCOUNTER — Ambulatory Visit: Payer: PPO | Admitting: Urology

## 2018-08-02 VITALS — BP 150/65 | HR 84 | Ht 68.0 in | Wt 218.0 lb

## 2018-08-02 DIAGNOSIS — N183 Chronic kidney disease, stage 3 unspecified: Secondary | ICD-10-CM

## 2018-08-02 DIAGNOSIS — N393 Stress incontinence (female) (male): Secondary | ICD-10-CM

## 2018-08-02 DIAGNOSIS — C61 Malignant neoplasm of prostate: Secondary | ICD-10-CM

## 2018-08-02 DIAGNOSIS — Z8551 Personal history of malignant neoplasm of bladder: Secondary | ICD-10-CM | POA: Diagnosis not present

## 2018-08-02 MED ORDER — APALUTAMIDE 60 MG PO TABS
240.0000 mg | ORAL_TABLET | Freq: Every day | ORAL | 11 refills | Status: DC
Start: 2018-08-02 — End: 2019-07-21

## 2018-08-02 NOTE — Progress Notes (Signed)
b  08/02/2018 5:05 PM   Erik Gibbs [birth year] [id number]  Referring provider: Mar Daring, PA-C [address] [place], [place] [address]  Chief Complaint  Patient presents with  . Prostate Cancer    establish care    HPI: 83 year old male who presents today to establish care for history of prostate and bladder cancer.  He was previously followed by Dr. Jacqlyn Larsen.  Extensive chart review was performed.   He also has a personal history of castrate resistant prostate cancer.  He status post prostatectomy 199 followed by salvage radiation with a recent rising PSA up to 1.7 which was previously undetectable in 2016.  He underwent orchiectomy in 2011 and was also recently started on Casodex.    He also has a personal history of bladder cancer.  This was diagnosed in 2007 with unknown pathology although Dr. Bjorn Loser notes indicate CIS of the bladder.  It also appears that he had a low-grade noninvasive urothelial carcinoma recurrence in 2018.  He did a biopsy performed in the office 04/2018 which was benign.  Most recently, he is found to have lesions within the bladder concerning for bladder cancer versus prostate cancer recurrence by Dr. Jacqlyn Larsen.  He underwent CT urogram on 04/2018 which was essentially unremarkable although based on urology notes, there was some concern for fullness of the left collecting system.  He was taken to the operating room in 05/2018 by Dr[facility]Bjurlin at [facility] for cystoscopy and diagnostic left ureteroscopy.  Based on this, there was some submucosal lesions adjacent to the trigone with a negative left diagnostic ureteroscopy.  He has stress incontinence from prostatectomy 1 pad per hour.    He denies any weight loss or bone pain.  PMH: Past Medical History:  Diagnosis Date  . Anemia   . COPD (chronic obstructive pulmonary disease) ([place])   . Diabetes mellitus   . GERD (gastroesophageal reflux disease)   . Hyperlipidemia, mixed   . Hypertension   . PVD  (peripheral vascular disease) [facility])     Surgical History: Past Surgical History:  Procedure Laterality Date  . BLADDER TUMOR EXCISION  2007  . PROSTATECTOMY  1995  . TESTICLE REMOVAL Bilateral    Dr. August Saucer    Home Medications:  Allergies as of 08/02/2018      Reactions   Amlodipine    BP drops severly   Nitrofuran Derivatives    Povidone-iodine       Medication List        Accurate as of 08/02/18  5:05 PM. Always use your most recent med list.          apalutamide 60 MG tablet Commonly known as:  ERLEADA Take 4 tablets (240 mg total) by mouth daily. May be taken with or without food. Swallow tablets whole.   aspirin 81 MG tablet Take 81 mg by mouth daily.   B-12 PO Take 1,000 mcg by mouth.   budesonide-formoterol 160-4.5 MCG/ACT inhaler Commonly known as:  SYMBICORT Inhale 2 puffs into the lungs 2 (two) times daily.   cilostazol 100 MG tablet Commonly known as:  PLETAL Take 1 tablet (100 mg total) by mouth 2 (two) times daily.   docusate sodium 50 MG capsule Commonly known as:  COLACE DOCUSATE SODIUM, 50MG  (Oral Capsule)  one every day for 0 days  Quantity: 0.00;  Refills: 0   Ordered :14-Jan-2011  Teena Dunk ;  Started 26-Jun-2009 Active   famotidine 20 MG tablet Commonly known as:  PEPCID Take 1 tablet (  20 mg total) by mouth 2 (two) times daily.   Fish Oil 1200 MG Caps Take by mouth daily.   freestyle lancets USE 1  TO CHECK GLUCOSE TWICE DAILY   FREESTYLE LITE test strip Generic drug:  glucose blood USE ONE STRIP TO CHECK GLUCOSE TWICE DAILY AND AS NEEDED   gabapentin 300 MG capsule Commonly known as:  NEURONTIN TAKE ONE CAPSULE BY MOUTH AT BEDTIME   insulin NPH-regular Human (70-30) 100 UNIT/ML injection Commonly known as:  NOVOLIN 70/30 Inject into the skin as directed.   ipratropium-albuterol 0.5-2.5 (3) MG/3ML Soln Commonly known as:  DUONEB Take 3 mLs by nebulization every 4 (four) hours as needed.   iron polysaccharides 150 MG  capsule Commonly known as:  NIFEREX Take 150 mg by mouth 3 (three) times daily.   loratadine 10 MG tablet Commonly known as:  CLARITIN Take 10 mg by mouth daily.   montelukast 10 MG tablet Commonly known as:  SINGULAIR Take 1 tablet (10 mg total) by mouth at bedtime.   niacin 500 MG CR capsule NIACIN, 500MG  (Oral Capsule Extended Release)  1 Every Day for 0 days  Quantity: 0.00;  Refills: 0   Ordered :08-Oct-2010  [name] ;  Started 28-Mar-2009 Active   pyridOXINE 100 MG tablet Commonly known as:  VITAMIN B-6 Take 100 mg by mouth daily.   simvastatin 20 MG tablet Commonly known as:  ZOCOR Take 1 tablet (20 mg total) by mouth daily.   torsemide 10 MG tablet Commonly known as:  DEMADEX TAKE 1 TABLET BY MOUTH IN THE MORNING AS NEEDED       Allergies:  Allergies  Allergen Reactions  . Amlodipine     BP drops severly  . Nitrofuran Derivatives   . Povidone-Iodine     Family History: Family History  Problem Relation Age of Onset  . COPD Brother   . Diabetes Brother   . Heart attack Brother   . Emphysema Father   . Cancer Father        prostate  . Depression Mother   . Heart disease Mother   . Ulcers Mother   . Diabetes Other   . Heart failure Other   . Asthma Son        childhood    Social History:  reports that he quit smoking about 7 years ago. His smoking use included cigarettes. He has a 186.00 pack-year smoking history. He has never used smokeless tobacco. He reports that he does not drink alcohol or use drugs.  ROS: UROLOGY Frequent Urination?: Yes Hard to postpone urination?: No Burning/pain with urination?: No Get up at night to urinate?: Yes Leakage of urine?: Yes Urine stream starts and stops?: No Trouble starting stream?: No Do you have to strain to urinate?: No Blood in urine?: No Urinary tract infection?: No Sexually transmitted disease?: No Injury to kidneys or bladder?: No Painful intercourse?: No Weak stream?: No Erection  problems?: No Penile pain?: No  Gastrointestinal Nausea?: No Vomiting?: No Indigestion/heartburn?: No Diarrhea?: No Constipation?: No  Constitutional Fever: No Night sweats?: No Weight loss?: No Fatigue?: No  Skin Skin rash/lesions?: No Itching?: No  Eyes Blurred vision?: No Double vision?: No  Ears/Nose/Throat Sore throat?: No Sinus problems?: No  Hematologic/Lymphatic Swollen glands?: No Easy bruising?: Yes  Cardiovascular Leg swelling?: No Chest pain?: No  Respiratory Cough?: Yes Shortness of breath?: Yes  Endocrine Excessive thirst?: No  Musculoskeletal Back pain?: No Joint pain?: No  Neurological Headaches?: No Dizziness?: No  Psychologic  Depression?: No Anxiety?: No  Physical Exam: BP (!) 150/65   Pulse 84   Ht 5\' 8"  (1.727 m)   Wt 218 lb (98.9 kg)   BMI 33.15 kg/m   Constitutional:  Alert and oriented, No acute distress.  Accompanied by wife. HEENT: [employer] AT, moist mucus membranes.  Trachea midline, no masses. Cardiovascular: No clubbing, cyanosis, or edema. Respiratory: Normal respiratory effort, no increased work of breathing. GI: Abdomen is soft, nontender, nondistended, no abdominal masses GU: No CVA tenderness Skin: No rashes, bruises or suspicious lesions. Neurologic: Grossly intact, no focal deficits, moving all 4 extremities. Psychiatric: Normal mood and affect.  Laboratory Data: Lab Results  Component Value Date   WBC 5.4 06/29/2018   HGB 9.0 (L) 06/29/2018   HCT 28.0 (L) 06/29/2018   MCV 100 (H) 06/29/2018   PLT 262 06/29/2018    Lab Results  Component Value Date   CREATININE 3.00 (H) 06/29/2018   This is up from his previous baseline of 2.0.  Lab Results  Component Value Date   HGBA1C 7.0 (A) 06/23/2018    Urinalysis No results found for: COLORURINE, APPEARANCEUR, LABSPEC, PHURINE, GLUCOSEU, HGBUR, BILIRUBINUR, KETONESUR, PROTEINUR, UROBILINOGEN, NITRITE, LEUKOCYTESUR  No results found for: LABMICR, [place],  RBCUA, LABEPIT, MUCUS, BACTERIA  Pertinent Imaging: Results for orders placed during the hospital encounter of 07/07/18  US RENAL   Narrative CLINICAL DATA:  Stage 3 chronic kidney disease.  EXAM: RENAL / URINARY TRACT ULTRASOUND COMPLETE  COMPARISON:  Abdomen and pelvis CT dated 03/30/2008.  FINDINGS: Right Kidney:  Length: 9.6 cm. Normal echotexture. Diffuse parenchymal thinning and prominent renal sinus fat. No hydronephrosis.  Left Kidney:  Length: 9.5 cm. Normal echotexture. Diffuse parenchymal thinning and prominent renal sinus fat. No hydronephrosis.  Bladder:  Not visualized.  IMPRESSION: Bilateral renal parenchymal atrophy without hydronephrosis. Both kidneys have normal echotexture   Electronically Signed   By: Claudie Revering M.D.   On: 07/07/2018 11:40     Assessment & Plan:    1. Prostate cancer [facility]) Personal history of castrate resistant prostate cancer without evidence of metastasis on most recent CT scan in 04/2018 PSA repeated today We will obtain bone scan to ensure that there is no evidence of bony metastatic disease although not suspected given lack of symptoms and normal CT scan We discussed that in the past several years, there is been newly approved medication for nonmetastatic castrate resistant prostate cancer -decreases progression to metastatic disease fairly significantly We discussed initiation of apalutamide daily, discussed risk and benefits and possible side effects of the medication He was given 1 month of samples and we will work with a specialty pharmacy to start this medication - PSA - NM Bone Scan Whole Body; Future  2. History of bladder cancer Personal history of bladder cancer, most recent recurrence in 2018 Currently on a every 6 month cystoscopy schedule, last cystoscopy 05/2018 We will arrange for cystoscopy in 11/2018 for continued surveillance  3. Urinary incontinence, stress, male Baseline severe stress urinary  incontinence Poor candidate for operative intervention Discussed alternatives including clamp, not interested  4. Chronic kidney disease (CKD), stage III (moderate) (HCC) Baseline CKD, most recent creatinine up to 3.0 from baseline of 2.0 Follow-up renal ultrasound negative for hydronephrosis or urinary obstruction Most likely medical renal in nature   Return in about 4 weeks (around 08/30/2018) for f/u bone scan in 1 months, cysto in Jan.  Hollice Espy, [place] [address] [place], [place] [address] [phone]   Notably today, his wife pulled out an e-cigarette and began smoking during the visit.  She was asked to put with a cigarette but became agitated and left abruptly.  I apologized to the patient but also reinforced that this is a nonsmoking office which includes vaping.

## 2018-08-03 ENCOUNTER — Telehealth: Payer: Self-pay | Admitting: Urology

## 2018-08-03 ENCOUNTER — Ambulatory Visit (INDEPENDENT_AMBULATORY_CARE_PROVIDER_SITE_OTHER): Payer: PPO

## 2018-08-03 ENCOUNTER — Telehealth: Payer: Self-pay

## 2018-08-03 DIAGNOSIS — Z23 Encounter for immunization: Secondary | ICD-10-CM | POA: Diagnosis not present

## 2018-08-03 LAB — PSA: PROSTATE SPECIFIC AG, SERUM: 2.5 ng/mL (ref 0.0–4.0)

## 2018-08-03 NOTE — Telephone Encounter (Signed)
-----   Message from Hollice Espy, MD sent at 08/03/2018  8:32 AM EDT ----- PSA continues to rise.  Strongly agree with starting medication prescribed yesterday.  Hollice Espy, MD

## 2018-08-03 NOTE — Telephone Encounter (Signed)
[place] [phone]  Erleada 60 mg, pharmacy wants to know quantity

## 2018-08-03 NOTE — Telephone Encounter (Signed)
Left pt mess to call 

## 2018-08-03 NOTE — Telephone Encounter (Signed)
Form faxed

## 2018-08-04 NOTE — Telephone Encounter (Signed)
Pt lmom stating she was returning a phone call she missed yesterday, message asked that she call. Please return pts call. Thanks.

## 2018-08-04 NOTE — Telephone Encounter (Signed)
Called pt informed him of the information below. Verbal understanding was given.

## 2018-09-02 ENCOUNTER — Encounter
Admission: RE | Admit: 2018-09-02 | Discharge: 2018-09-02 | Disposition: A | Discharge status: Home / Self Care | Payer: PPO | Source: Ambulatory Visit | Attending: Urology | Admitting: Urology

## 2018-09-02 DIAGNOSIS — C61 Malignant neoplasm of prostate: Secondary | ICD-10-CM | POA: Insufficient documentation

## 2018-09-02 MED ORDER — TECHNETIUM TC 99M MEDRONATE IV KIT
21.4500 | PACK | Freq: Once | INTRAVENOUS | Status: AC | PRN
  Administered 2018-09-02: 21.45 via INTRAVENOUS

## 2018-09-06 ENCOUNTER — Ambulatory Visit: Payer: PPO | Admitting: Urology

## 2018-09-06 ENCOUNTER — Encounter: Payer: Self-pay | Admitting: Urology

## 2018-09-06 VITALS — BP 155/68 | HR 78 | Resp 16 | Ht 68.0 in | Wt 208.5 lb

## 2018-09-06 DIAGNOSIS — Z8551 Personal history of malignant neoplasm of bladder: Secondary | ICD-10-CM | POA: Diagnosis not present

## 2018-09-06 DIAGNOSIS — C61 Malignant neoplasm of prostate: Secondary | ICD-10-CM

## 2018-09-06 NOTE — Progress Notes (Signed)
09/06/2018 4:37 PM   Erik Gibbs 02-12-1935 [id number]  Referring provider: Mar Daring, PA-C [facility] [place], [place] [address]  Chief Complaint  Patient presents with  . Follow-up    HPI: 83 year old male with nonmetastatic castrate resistant prostate cancer returns today for follow-up.  He was first seen and evaluated in our office on 08/02/2018 noted to have a rising PSA up to 2.5 which is previously undetectable.  He status post orchiectomy in 2011.  Since last visit, he was started on apalutamide and given 6 weeks of coverage.  He initially had some difficulty affording the prescription but ultimately has qualified for an outreach program and is received the medications as of this Thursday free of cost.  He is tolerating the medication without any side effects.  Since last visit, he also had a bone scan on 09/03/2017 which showed no evidence of metastatic disease.  Most recently, he is found to have lesions within the bladder concerning for bladder cancer versus prostate cancer recurrence by Dr. Jacqlyn Larsen.  He underwent CT urogram on 04/2018 which was essentially unremarkable although based on urology notes, there was some concern for fullness of the left collecting system.  He was taken to the operating room in 05/2018 by Dr.Bjurlin at [facility] for cystoscopy and diagnostic left ureteroscopy.  Based on this, there was some submucosal lesions adjacent to the trigone with a negative left diagnostic ureteroscopy.  He has stress incontinence from prostatectomy 1 pad per hour.     PMH: Past Medical History:  Diagnosis Date  . Anemia   . COPD (chronic obstructive pulmonary disease) ([place])   . Diabetes mellitus   . GERD (gastroesophageal reflux disease)   . Hyperlipidemia, mixed   . Hypertension   . PVD (peripheral vascular disease) [facility])     Surgical History: Past Surgical History:  Procedure Laterality Date  . BLADDER TUMOR EXCISION  2007  .  PROSTATECTOMY  1995  . TESTICLE REMOVAL Bilateral    Dr. August Saucer    Home Medications:  Allergies as of 09/06/2018      Reactions   Amlodipine    BP drops severly   Nitrofuran Derivatives    Povidone-iodine       Medication List        Accurate as of 09/06/18  4:37 PM. Always use your most recent med list.          apalutamide 60 MG tablet Commonly known as:  ERLEADA Take 4 tablets (240 mg total) by mouth daily. May be taken with or without food. Swallow tablets whole.   aspirin 81 MG tablet Take 81 mg by mouth daily.   B-12 PO Take 1,000 mcg by mouth.   budesonide-formoterol 160-4.5 MCG/ACT inhaler Commonly known as:  SYMBICORT Inhale 2 puffs into the lungs 2 (two) times daily.   cilostazol 100 MG tablet Commonly known as:  PLETAL Take 1 tablet (100 mg total) by mouth 2 (two) times daily.   docusate sodium 50 MG capsule Commonly known as:  COLACE DOCUSATE SODIUM, 50MG  (Oral Capsule)  one every day for 0 days  Quantity: 0.00;  Refills: 0   Ordered :14-Jan-2011  Teena Dunk ;  Started 26-Jun-2009 Active   famotidine 20 MG tablet Commonly known as:  PEPCID Take 1 tablet (20 mg total) by mouth 2 (two) times daily.   Fish Oil 1200 MG Caps Take by mouth daily.   freestyle lancets USE 1  TO CHECK GLUCOSE TWICE DAILY   FREESTYLE  LITE test strip Generic drug:  glucose blood USE ONE STRIP TO CHECK GLUCOSE TWICE DAILY AND AS NEEDED   gabapentin 300 MG capsule Commonly known as:  NEURONTIN TAKE ONE CAPSULE BY MOUTH AT BEDTIME   insulin NPH-regular Human (70-30) 100 UNIT/ML injection Commonly known as:  NOVOLIN 70/30 Inject into the skin as directed.   ipratropium-albuterol 0.5-2.5 (3) MG/3ML Soln Commonly known as:  DUONEB Take 3 mLs by nebulization every 4 (four) hours as needed.   iron polysaccharides 150 MG capsule Commonly known as:  NIFEREX Take 150 mg by mouth 3 (three) times daily.   loratadine 10 MG tablet Commonly known as:  CLARITIN Take 10  mg by mouth daily.   montelukast 10 MG tablet Commonly known as:  SINGULAIR Take 1 tablet (10 mg total) by mouth at bedtime.   niacin 500 MG CR capsule NIACIN, 500MG  (Oral Capsule Extended Release)  1 Every Day for 0 days  Quantity: 0.00;  Refills: 0   Ordered :08-Oct-2010  [name] ;  Started 28-Mar-2009 Active   pyridOXINE 100 MG tablet Commonly known as:  VITAMIN B-6 Take 100 mg by mouth daily.   simvastatin 20 MG tablet Commonly known as:  ZOCOR Take 1 tablet (20 mg total) by mouth daily.   torsemide 10 MG tablet Commonly known as:  DEMADEX TAKE 1 TABLET BY MOUTH IN THE MORNING AS NEEDED       Allergies:  Allergies  Allergen Reactions  . Amlodipine     BP drops severly  . Nitrofuran Derivatives   . Povidone-Iodine     Family History: Family History  Problem Relation Age of Onset  . COPD Brother   . Diabetes Brother   . Heart attack Brother   . Emphysema Father   . Cancer Father        prostate  . Depression Mother   . Heart disease Mother   . Ulcers Mother   . Diabetes Other   . Heart failure Other   . Asthma Son        childhood    Social History:  reports that he quit smoking about 8 years ago. His smoking use included cigarettes. He has a 186.00 pack-year smoking history. He has never used smokeless tobacco. He reports that he does not drink alcohol or use drugs.  ROS: UROLOGY Frequent Urination?: No Hard to postpone urination?: No Burning/pain with urination?: No Get up at night to urinate?: No Leakage of urine?: Yes Urine stream starts and stops?: No Trouble starting stream?: No Do you have to strain to urinate?: No Blood in urine?: No Urinary tract infection?: No Sexually transmitted disease?: No Injury to kidneys or bladder?: No Painful intercourse?: No Weak stream?: No Erection problems?: No Penile pain?: No  Gastrointestinal Nausea?: No Vomiting?: No Indigestion/heartburn?: No Diarrhea?: No Constipation?:  No  Constitutional Fever: No Night sweats?: No Weight loss?: No Fatigue?: No  Skin Skin rash/lesions?: No Itching?: No  Eyes Blurred vision?: No Double vision?: No  Ears/Nose/Throat Sore throat?: No Sinus problems?: No  Hematologic/Lymphatic Swollen glands?: No Easy bruising?: No  Cardiovascular Leg swelling?: No Chest pain?: No  Respiratory Cough?: Yes Shortness of breath?: No  Endocrine Excessive thirst?: No  Musculoskeletal Back pain?: No Joint pain?: No  Neurological Headaches?: No Dizziness?: No  Psychologic Depression?: No Anxiety?: No  Physical Exam: BP (!) 155/68   Pulse 78   Resp 16   Ht 5\' 8"  (1.727 m)   Wt 208 lb 8 oz (94.6 kg)  BMI 31.70 kg/m   Constitutional:  Alert and oriented, No acute distress.  Accompanied by wife today. HEENT: [place] AT, moist mucus membranes.  Trachea midline, no masses. Cardiovascular: No clubbing, cyanosis, or edema. Respiratory: Normal respiratory effort, no increased work of breathing. Skin: No rashes, bruises or suspicious lesions. Neurologic: Grossly intact, no focal deficits, moving all 4 extremities. Psychiatric: Normal mood and affect.  Laboratory Data: Lab Results  Component Value Date   WBC 5.4 06/29/2018   HGB 9.0 (L) 06/29/2018   HCT 28.0 (L) 06/29/2018   MCV 100 (H) 06/29/2018   PLT 262 06/29/2018    Lab Results  Component Value Date   CREATININE 3.00 (H) 06/29/2018     Lab Results  Component Value Date   HGBA1C 7.0 (A) 06/23/2018    Urinalysis NA  Pertinent Imaging: Results for orders placed during the hospital encounter of 07/07/18  US RENAL   Narrative CLINICAL DATA:  Stage 3 chronic kidney disease.  EXAM: RENAL / URINARY TRACT ULTRASOUND COMPLETE  COMPARISON:  Abdomen and pelvis CT dated 03/30/2008.  FINDINGS: Right Kidney:  Length: 9.6 cm. Normal echotexture. Diffuse parenchymal thinning and prominent renal sinus fat. No hydronephrosis.  Left Kidney:  Length:  9.5 cm. Normal echotexture. Diffuse parenchymal thinning and prominent renal sinus fat. No hydronephrosis.  Bladder:  Not visualized.  IMPRESSION: Bilateral renal parenchymal atrophy without hydronephrosis. Both kidneys have normal echotexture   Electronically Signed   By: Claudie Revering M.D.   On: 07/07/2018 11:40    CLINICAL DATA:  Prostate cancer.  Evaluate for recurrence  EXAM: NUCLEAR MEDICINE WHOLE BODY BONE SCAN  TECHNIQUE: Whole body anterior and posterior images were obtained approximately 3 hours after intravenous injection of radiopharmaceutical.  RADIOPHARMACEUTICALS:  21.45 mCi Technetium-58m MDP IV  COMPARISON:  04/03/2011 .  FINDINGS: No abnormal focus of increased or decreased radiotracer uptake specific for bone metastasis is identified. There are degenerative type changes involving both acromioclavicular joints as well as both knees. Normal physiologic tracer activity seen within the kidneys an urinary bladder  IMPRESSION: 1. No findings identified to suggest prostate cancer bone metastasis.   Electronically Signed   By: Kerby Moors M.D.   On: 09/03/2018 16:27  Bone scan personally reviewed today and with the patient  Assessment & Plan:    1. Prostate cancer ([place]) Nonmetastatic castrate resistant prostate cancer Started on apalutamide 07/2018 Repeat PSA today as would anticipate PSA reduction Continue this medication as long as patient tolerates that his PSA - PSA  2. History of bladder cancer Personal history of bladder cancer, most recent recurrence in 2018 Currently on a every 6 month cystoscopy schedule, last cystoscopy 05/2018 cystoscopy scheduled 11/2018 for continued surveillance   Return for Jan cysto , PSA.  Hollice Espy, MD  [facility] [address], [address] [place], [place] [address] [phone]

## 2018-09-07 LAB — PSA: Prostate Specific Ag, Serum: 0.4 ng/mL (ref 0.0–4.0)

## 2018-09-08 ENCOUNTER — Telehealth: Payer: Self-pay | Admitting: Family Medicine

## 2018-09-08 NOTE — Telephone Encounter (Signed)
Patient notified

## 2018-09-08 NOTE — Telephone Encounter (Signed)
-----   Message from Hollice Espy, MD sent at 09/07/2018  8:19 AM EDT ----- PSA is responding nicely, now down to 0.4!  Hollice Espy, MD

## 2018-09-14 DIAGNOSIS — I129 Hypertensive chronic kidney disease with stage 1 through stage 4 chronic kidney disease, or unspecified chronic kidney disease: Secondary | ICD-10-CM | POA: Diagnosis not present

## 2018-09-14 DIAGNOSIS — E875 Hyperkalemia: Secondary | ICD-10-CM | POA: Diagnosis not present

## 2018-09-14 DIAGNOSIS — N184 Chronic kidney disease, stage 4 (severe): Secondary | ICD-10-CM | POA: Diagnosis not present

## 2018-09-14 DIAGNOSIS — E1122 Type 2 diabetes mellitus with diabetic chronic kidney disease: Secondary | ICD-10-CM | POA: Diagnosis not present

## 2018-09-27 NOTE — Progress Notes (Signed)
Patient: Erik Gibbs Male    DOB: 06/06/1935   83 y.o.   MRN: [id number] Visit Date: 09/28/2018  Today's Provider: Mar Daring, PA-C   Chief Complaint  Patient presents with  . Follow-up    HTN   Subjective:    HPI  Diabetes Mellitus Type II, Follow-up:   Lab Results  Component Value Date   HGBA1C 6.6 (A) 09/28/2018   HGBA1C 7.0 (A) 06/23/2018   HGBA1C 6.7 03/17/2018    Last seen for diabetes 3 months ago.  Management since then includes none.Continue Novolin 70/30 He reports excellent compliance with treatment. He is not having side effects.  Current symptoms include none and have been stable. Home blood sugar records: fasting range: 90's-120's and at night they are 200's  Episodes of hypoglycemia? no   Most Recent Eye Exam: not up to date Weight trend: stable Prior visit with dietician: no Current diet: well balanced Current exercise: no regular exercise  Pertinent Labs:    Component Value Date/Time   CHOL 121 06/29/2018 0805   TRIG 101 06/29/2018 0805   HDL 42 06/29/2018 0805   LDLCALC 59 06/29/2018 0805   CREATININE 3.00 (H) 06/29/2018 0805    Wt Readings from Last 3 Encounters:  09/28/18 209 lb 9.6 oz (95.1 kg)  09/06/18 208 lb 8 oz (94.6 kg)  08/02/18 218 lb (98.9 kg)    ------------------------------------------------------------------------     Allergies  Allergen Reactions  . Amlodipine     BP drops severly  . Nitrofuran Derivatives   . Povidone-Iodine      Current Outpatient Medications:  .  apalutamide (ERLEADA) 60 MG tablet, Take 4 tablets (240 mg total) by mouth daily. May be taken with or without food. Swallow tablets whole., Disp: 4 tablet, Rfl: 11 .  aspirin 81 MG tablet, Take 81 mg by mouth daily.  , Disp: , Rfl:  .  budesonide-formoterol (SYMBICORT) 160-4.5 MCG/ACT inhaler, Inhale 2 puffs into the lungs 2 (two) times daily., Disp: 3 Inhaler, Rfl: 3 .  cilostazol (PLETAL) 100 MG tablet, Take 1 tablet (100 mg  total) by mouth 2 (two) times daily., Disp: 180 tablet, Rfl: 3 .  Cyanocobalamin (B-12 PO), Take 1,000 mcg by mouth., Disp: , Rfl:  .  docusate sodium (COLACE) 50 MG capsule, DOCUSATE SODIUM, 50MG  (Oral Capsule)  one every day for 0 days  Quantity: 0.00;  Refills: 0   Ordered :14-Jan-2011  Teena Dunk ;  Started 26-Jun-2009 Active, Disp: , Rfl:  .  famotidine (PEPCID) 20 MG tablet, Take 1 tablet (20 mg total) by mouth 2 (two) times daily., Disp: 180 tablet, Rfl: 3 .  FREESTYLE LITE test strip, USE ONE STRIP TO CHECK GLUCOSE TWICE DAILY AND AS NEEDED, Disp: 100 each, Rfl: 7 .  gabapentin (NEURONTIN) 300 MG capsule, TAKE ONE CAPSULE BY MOUTH AT BEDTIME, Disp: 90 capsule, Rfl: 3 .  insulin NPH-insulin regular (NOVOLIN 70/30) (70-30) 100 UNIT/ML injection, Inject into the skin as directed.  , Disp: , Rfl:  .  ipratropium-albuterol (DUONEB) 0.5-2.5 (3) MG/3ML SOLN, Take 3 mLs by nebulization every 4 (four) hours as needed., Disp: 1080 mL, Rfl: 3 .  iron polysaccharides (NIFEREX) 150 MG capsule, Take 150 mg by mouth 3 (three) times daily., Disp: , Rfl:  .  Lancets (FREESTYLE) lancets, USE 1  TO CHECK GLUCOSE TWICE DAILY, Disp: 200 each, Rfl: 5 .  loratadine (CLARITIN) 10 MG tablet, Take 10 mg by mouth daily.  , Disp: ,  Rfl:  .  montelukast (SINGULAIR) 10 MG tablet, Take 1 tablet (10 mg total) by mouth at bedtime., Disp: 90 tablet, Rfl: 3 .  niacin 500 MG CR capsule, NIACIN, 500MG  (Oral Capsule Extended Release)  1 Every Day for 0 days  Quantity: 0.00;  Refills: 0   Ordered :08-Oct-2010  [name] ;  Started 28-Mar-2009 Active, Disp: , Rfl:  .  Omega-3 Fatty Acids (FISH OIL) 1200 MG CAPS, Take by mouth daily.  , Disp: , Rfl:  .  pyridOXINE (VITAMIN B-6) 100 MG tablet, Take 100 mg by mouth daily.  , Disp: , Rfl:  .  simvastatin (ZOCOR) 20 MG tablet, Take 1 tablet (20 mg total) by mouth daily., Disp: 90 tablet, Rfl: 3 .  torsemide (DEMADEX) 10 MG tablet, TAKE 1 TABLET BY MOUTH IN THE MORNING AS  NEEDED, Disp: , Rfl: 6  Review of Systems  Constitutional: Negative.   Respiratory: Negative.   Cardiovascular: Negative.   Neurological: Negative.     Social History   Tobacco Use  . Smoking status: Former Smoker    Packs/day: 3.00    Years: 62.00    Pack years: 186.00    Types: Cigarettes    Last attempt to quit: 08/08/2010    Years since quitting: 8.1  . Smokeless tobacco: Never Used  Substance Use Topics  . Alcohol use: No    Comment: former heavy alcohol use   Objective:   BP 130/78 (BP Location: Right Arm, Patient Position: Sitting, Cuff Size: Normal)   Pulse 68   Temp 98.5 F (36.9 C) (Oral)   Resp 16   Wt 209 lb 9.6 oz (95.1 kg)   SpO2 96%   BMI 31.87 kg/m  Vitals:   09/28/18 0812  BP: 130/78  Pulse: 68  Resp: 16  Temp: 98.5 F (36.9 C)  TempSrc: Oral  SpO2: 96%  Weight: 209 lb 9.6 oz (95.1 kg)     Physical Exam  Constitutional: He appears well-developed and well-nourished. No distress.  HENT:  Head: Normocephalic and atraumatic.  Neck: Normal range of motion. Neck supple.  Cardiovascular: Normal rate, regular rhythm and normal heart sounds. Exam reveals no gallop and no friction rub.  No murmur heard. Pulmonary/Chest: Effort normal. No respiratory distress. He has wheezes. He has no rales.  Musculoskeletal: He exhibits edema (trace to 1+ bilaterally).  Skin: He is not diaphoretic.  Vitals reviewed.       Assessment & Plan:     1. Type 2 diabetes mellitus with diabetic nephropathy, with long-term current use of insulin (HCC) A1c improved to 6.6 from 7.0. Continue current medical treatment plan. I will see him back in 3 months. - POCT glycosylated hemoglobin (Hb A1C)  2. Benign hypertension with CKD (chronic kidney disease) stage III (HCC) Stable. Will check labs as below and f/u pending results. Followed by Dr. Candiss Norse. I will forward results to Dr. Candiss Norse.  - CBC w/Diff/Platelet - Basic Metabolic Panel (BMET)  3. CAFL (chronic airflow  limitation) (HCC) Stable.   4. Mixed simple and mucopurulent chronic bronchitis (HCC) Stable.   5. H/O malignant neoplasm of prostate Followed by Urology, Dr. Erlene Quan. Improving. Will check labs as below and f/u pending results and send to Dr. Erlene Quan.  - PSA  6. Other iron deficiency anemia Stable. Will check labs as below and f/u pending results. - CBC w/Diff/Platelet  7. Hyperkalemia Was elevated at last check. Changed to Torsemide by Dr. Candiss Norse. Doing better. Will check labs as below and f/u  pending results. - Basic Metabolic Panel (BMET)       Mar Daring, PA-C  [facility]

## 2018-09-28 ENCOUNTER — Encounter: Payer: Self-pay | Admitting: Physician Assistant

## 2018-09-28 ENCOUNTER — Ambulatory Visit (INDEPENDENT_AMBULATORY_CARE_PROVIDER_SITE_OTHER): Payer: PPO | Admitting: Physician Assistant

## 2018-09-28 VITALS — BP 130/78 | HR 68 | Temp 98.5°F | Resp 16 | Wt 209.6 lb

## 2018-09-28 DIAGNOSIS — Z794 Long term (current) use of insulin: Secondary | ICD-10-CM

## 2018-09-28 DIAGNOSIS — E875 Hyperkalemia: Secondary | ICD-10-CM | POA: Diagnosis not present

## 2018-09-28 DIAGNOSIS — E1121 Type 2 diabetes mellitus with diabetic nephropathy: Secondary | ICD-10-CM | POA: Diagnosis not present

## 2018-09-28 DIAGNOSIS — J449 Chronic obstructive pulmonary disease, unspecified: Secondary | ICD-10-CM | POA: Diagnosis not present

## 2018-09-28 DIAGNOSIS — N183 Chronic kidney disease, stage 3 unspecified: Secondary | ICD-10-CM

## 2018-09-28 DIAGNOSIS — I129 Hypertensive chronic kidney disease with stage 1 through stage 4 chronic kidney disease, or unspecified chronic kidney disease: Secondary | ICD-10-CM

## 2018-09-28 DIAGNOSIS — J418 Mixed simple and mucopurulent chronic bronchitis: Secondary | ICD-10-CM | POA: Diagnosis not present

## 2018-09-28 DIAGNOSIS — D508 Other iron deficiency anemias: Secondary | ICD-10-CM

## 2018-09-28 DIAGNOSIS — Z8546 Personal history of malignant neoplasm of prostate: Secondary | ICD-10-CM | POA: Diagnosis not present

## 2018-09-28 LAB — POCT GLYCOSYLATED HEMOGLOBIN (HGB A1C)
Est. average glucose Bld gHb Est-mCnc: 143
HEMOGLOBIN A1C: 6.6 % — AB (ref 4.0–5.6)

## 2018-09-29 LAB — CBC WITH DIFFERENTIAL/PLATELET
BASOS: 1 %
Basophils Absolute: 0.1 10*3/uL (ref 0.0–0.2)
EOS (ABSOLUTE): 0.4 10*3/uL (ref 0.0–0.4)
EOS: 6 %
Hematocrit: 31.5 % — ABNORMAL LOW (ref 37.5–51.0)
Hemoglobin: 10.6 g/dL — ABNORMAL LOW (ref 13.0–17.7)
Immature Grans (Abs): 0 10*3/uL (ref 0.0–0.1)
Immature Granulocytes: 0 %
LYMPHS ABS: 1.1 10*3/uL (ref 0.7–3.1)
Lymphs: 18 %
MCH: 32.3 pg (ref 26.6–33.0)
MCHC: 33.7 g/dL (ref 31.5–35.7)
MCV: 96 fL (ref 79–97)
MONOS ABS: 0.6 10*3/uL (ref 0.1–0.9)
Monocytes: 10 %
NEUTROS ABS: 4.2 10*3/uL (ref 1.4–7.0)
NEUTROS PCT: 65 %
PLATELETS: 229 10*3/uL (ref 150–450)
RBC: 3.28 x10E6/uL — ABNORMAL LOW (ref 4.14–5.80)
RDW: 12.6 % (ref 12.3–15.4)
WBC: 6.3 10*3/uL (ref 3.4–10.8)

## 2018-09-29 LAB — BASIC METABOLIC PANEL
BUN / CREAT RATIO: 15 (ref 10–24)
BUN: 35 mg/dL — ABNORMAL HIGH (ref 8–27)
CALCIUM: 9.4 mg/dL (ref 8.6–10.2)
CHLORIDE: 103 mmol/L (ref 96–106)
CO2: 25 mmol/L (ref 20–29)
Creatinine, Ser: 2.37 mg/dL — ABNORMAL HIGH (ref 0.76–1.27)
GFR, EST AFRICAN AMERICAN: 29 mL/min/{1.73_m2} — AB (ref >59)
GFR, EST NON AFRICAN AMERICAN: 25 mL/min/{1.73_m2} — AB (ref >59)
Glucose: 133 mg/dL — ABNORMAL HIGH (ref 65–99)
POTASSIUM: 5 mmol/L (ref 3.5–5.2)
Sodium: 144 mmol/L (ref 134–144)

## 2018-09-29 LAB — PSA: Prostate Specific Ag, Serum: 0.3 ng/mL (ref 0.0–4.0)

## 2018-09-30 ENCOUNTER — Telehealth: Payer: Self-pay

## 2018-09-30 NOTE — Telephone Encounter (Signed)
Patient's wife advised

## 2018-09-30 NOTE — Telephone Encounter (Signed)
-----   Message from Mar Daring, [place] sent at 09/29/2018  3:41 PM EST ----- Labs have all improved when compared to last check. PSA holding at 0.3. Labs forwarded to Dr. Candiss Norse and Dr. Erlene Quan as per requested.

## 2018-11-18 ENCOUNTER — Other Ambulatory Visit: Payer: Self-pay | Admitting: Physician Assistant

## 2018-11-18 DIAGNOSIS — J449 Chronic obstructive pulmonary disease, unspecified: Secondary | ICD-10-CM

## 2018-11-20 ENCOUNTER — Other Ambulatory Visit: Payer: Self-pay | Admitting: Physician Assistant

## 2018-11-20 DIAGNOSIS — Z794 Long term (current) use of insulin: Principal | ICD-10-CM

## 2018-11-20 DIAGNOSIS — E1121 Type 2 diabetes mellitus with diabetic nephropathy: Secondary | ICD-10-CM

## 2018-12-06 ENCOUNTER — Encounter: Payer: Self-pay | Admitting: Urology

## 2018-12-06 ENCOUNTER — Telehealth: Payer: Self-pay | Admitting: Radiology

## 2018-12-06 ENCOUNTER — Ambulatory Visit: Payer: PPO | Admitting: Urology

## 2018-12-06 VITALS — BP 138/57 | HR 91 | Ht 68.0 in | Wt 210.0 lb

## 2018-12-06 DIAGNOSIS — Z8551 Personal history of malignant neoplasm of bladder: Secondary | ICD-10-CM

## 2018-12-06 DIAGNOSIS — C61 Malignant neoplasm of prostate: Secondary | ICD-10-CM

## 2018-12-06 LAB — URINALYSIS, COMPLETE
Bilirubin, UA: NEGATIVE
Glucose, UA: NEGATIVE
Ketones, UA: NEGATIVE
Leukocytes, UA: NEGATIVE
NITRITE UA: NEGATIVE
Protein, UA: NEGATIVE
Specific Gravity, UA: 1.01 (ref 1.005–1.030)
Urobilinogen, Ur: 0.2 mg/dL (ref 0.2–1.0)
pH, UA: 5.5 (ref 5.0–7.5)

## 2018-12-06 LAB — MICROSCOPIC EXAMINATION
Bacteria, UA: NONE SEEN
Epithelial Cells (non renal): NONE SEEN /hpf (ref 0–10)
WBC UA: NONE SEEN /HPF (ref 0–5)

## 2018-12-06 NOTE — H&P (View-Only) (Signed)
°  12/06/2018   CC:  Chief Complaint  Patient presents with   Cysto    BLADDER CANCER    HPI: Erik Gibbs is a male 84 y.o., accompanied by his wife, with a history of nonmetastatic castrate resistant prostate cancer presents today for his initial surveillance cystoscopy in our office.  He also has a personal history of bladder cancer.  This was diagnosed in 2007 with unknown pathology although Dr. Bjorn Loser notes indicate CIS of the bladder.  It also appears that he had a low-grade noninvasive urothelial carcinoma recurrence in 2018.  He did a biopsy performed in the office 04/2018 which was benign.  This was followed by being taken to the operating room in 719 for diagnostic left ureteroscopy which was unremarkable.  He has a personal history of nonmetastatic castrate resistant prostate cancer status post orchiectomy with recent rising creatinine.  Please see previous notes for details.  He was started on apalutamide with downward trending PSA.  His most recent PSA (09/28/2018) was 0.3. He is responding well to this medication not significant side effects.  Patient was unable to provide UA today due to incontinence.   Patient is on pletal for PAD/ rest pain.  Blood pressure (!) 138/57, pulse 91, height 5\' 8"  (1.727 m), weight 210 lb (95.3 kg). NED. A&Ox3.   No respiratory distress   Abd soft, NT, ND Normal phallus with bilateral descended testicles  Cystoscopy Procedure Note  Patient identification was confirmed, informed consent was obtained, and patient was prepped using Betadine solution.  Lidocaine jelly was administered per urethral meatus.    Pre-Procedure: - Inspection reveals a normal caliber ureteral meatus.  Procedure: The flexible cystoscope was introduced without difficulty - No urethral strictures/lesions are present. - Surgically absent prostate. Empty fossa. - Raised papillary lesion on the right lateral bladder wall/bladder neck. Approximately 1 cm. - UO in  normal position. Left UO with somewhat previously resected appearance. - Irregular diffuse vascularity on posterior lateral wall. Consistent with radiation change. -Stellate scar on posterior bladder wall. - Normal bladder neck - Bilateral ureteral orifices identified - Bladder mucosa  reveals no ulcers, tumors, or lesions - No bladder stones - No trabeculation  Retroflexion unremarkable  Post-Procedure: - Patient tolerated the procedure well  Assessment/ Plan:  1. Nonmetastatic castrate resistant prostate cancer Continue apalutamide See in 2 months for PSA. Plan to check PSA  3 times a year while on apalutamide.  2. History of bladder cancer  Area of recurrence noted on cystoscopy. Discussed plan for TURBT/Retrograde Pyelogram and instillation of gemcitabine Risk and benefits were discussed in detail including risk of bleeding, infection, damage surrounding structures, bladder irritation, amongst others.  All questions were answered.  Schedule surgery as above  I, Temidayo Atanda-Ogunleye , am acting as a scribe for Hollice Espy, MD  I have reviewed the above documentation for accuracy and completeness, and I agree with the above.   Hollice Espy, MD

## 2018-12-06 NOTE — Telephone Encounter (Signed)
Patient was given the [facility] Information form below as well as the Instructions for Pre-Admission Testing form & a map of [facility]   [place], [place], [place] [address] Telephone: [phone] Fax: [phone]   Thank you for choosing [facility] for your upcoming surgery!  We are always here to assist in your urological needs.  Please read the following information with specific details for your upcoming appointments related to your surgery. Please contact Laylamarie Meuser at [phone] Option 3 with any questions.  The Name of Your Surgery: cystoscopy, bilateral retrograde pyelogram, transurethral resection of bladder tumor with intravesical gemcitabine Your Surgery Date: 12/26/2018 Your Surgeon: Hollice Espy  Please call Same Day Surgery at [phone] between the hours of 1pm-3pm one day prior to your surgery. They will inform you of the time to arrive at Same Day Surgery which is located on the second floor of the [facility].   Please refer to the attached letter regarding instructions for Pre-Admission Testing. You will receive a call from the [place] office regarding your appointment with them.  The Pre-Admission Testing office is located at [place], on the first floor of the [place] [facility] (office is to the right as you enter through the [employer] of the [facility]). Please have all medications you are currently taking and your insurance card available.   Patient was advised to have nothing to eat or drink after midnight the night prior to surgery except that he may have only water until 2 hours before surgery with nothing to drink within 2 hours of surgery.  The patient states he currently takes Pletal & aspirin 81mg  & will be informed when to hold medication once clearance is  obtained from PCP. Patient's questions were answered and he expressed understanding of these instructions.

## 2018-12-06 NOTE — Progress Notes (Signed)
°  12/06/2018   CC:  Chief Complaint  Patient presents with   Cysto    BLADDER CANCER    HPI: Erik Gibbs is a male 84 y.o., accompanied by his wife, with a history of nonmetastatic castrate resistant prostate cancer presents today for his initial surveillance cystoscopy in our office.  He also has a personal history of bladder cancer.  This was diagnosed in 2007 with unknown pathology although Dr. Bjorn Loser notes indicate CIS of the bladder.  It also appears that he had a low-grade noninvasive urothelial carcinoma recurrence in 2018.  He did a biopsy performed in the office 04/2018 which was benign.  This was followed by being taken to the operating room in 719 for diagnostic left ureteroscopy which was unremarkable.  He has a personal history of nonmetastatic castrate resistant prostate cancer status post orchiectomy with recent rising creatinine.  Please see previous notes for details.  He was started on apalutamide with downward trending PSA.  His most recent PSA (09/28/2018) was 0.3. He is responding well to this medication not significant side effects.  Patient was unable to provide UA today due to incontinence.   Patient is on pletal for PAD/ rest pain.  Blood pressure (!) 138/57, pulse 91, height 5\' 8"  (1.727 m), weight 210 lb (95.3 kg). NED. A&Ox3.   No respiratory distress   Abd soft, NT, ND Normal phallus with bilateral descended testicles  Cystoscopy Procedure Note  Patient identification was confirmed, informed consent was obtained, and patient was prepped using Betadine solution.  Lidocaine jelly was administered per urethral meatus.    Pre-Procedure: - Inspection reveals a normal caliber ureteral meatus.  Procedure: The flexible cystoscope was introduced without difficulty - No urethral strictures/lesions are present. - Surgically absent prostate. Empty fossa. - Raised papillary lesion on the right lateral bladder wall/bladder neck. Approximately 1 cm. - UO in  normal position. Left UO with somewhat previously resected appearance. - Irregular diffuse vascularity on posterior lateral wall. Consistent with radiation change. -Stellate scar on posterior bladder wall. - Normal bladder neck - Bilateral ureteral orifices identified - Bladder mucosa  reveals no ulcers, tumors, or lesions - No bladder stones - No trabeculation  Retroflexion unremarkable  Post-Procedure: - Patient tolerated the procedure well  Assessment/ Plan:  1. Nonmetastatic castrate resistant prostate cancer Continue apalutamide See in 2 months for PSA. Plan to check PSA  3 times a year while on apalutamide.  2. History of bladder cancer  Area of recurrence noted on cystoscopy. Discussed plan for TURBT/Retrograde Pyelogram and instillation of gemcitabine Risk and benefits were discussed in detail including risk of bleeding, infection, damage surrounding structures, bladder irritation, amongst others.  All questions were answered.  Schedule surgery as above  I, Temidayo Atanda-Ogunleye , am acting as a scribe for Hollice Espy, MD  I have reviewed the above documentation for accuracy and completeness, and I agree with the above.   Hollice Espy, MD

## 2018-12-08 ENCOUNTER — Other Ambulatory Visit: Payer: Self-pay | Admitting: Radiology

## 2018-12-08 DIAGNOSIS — Z8551 Personal history of malignant neoplasm of bladder: Secondary | ICD-10-CM

## 2018-12-08 MED ORDER — SODIUM CHLORIDE 0.9 % IR SOLN: 2000.0000 mg | Freq: Once | Status: DC

## 2018-12-09 ENCOUNTER — Telehealth: Payer: Self-pay

## 2018-12-09 NOTE — Telephone Encounter (Signed)
Erik Gibbs with [facility] called requesting an update on the Surgical Clearance form they faxed to our office on Tuesday. Please call her back with an update of when this would be completed. Patient is scheduled to have surgery on 12/26/2018 with Dr. Erlene Quan. Call back is [phone].

## 2018-12-09 NOTE — Telephone Encounter (Signed)
Left message notifying Amy pt's appt isn't till 12/12/18.

## 2018-12-10 LAB — CULTURE, URINE COMPREHENSIVE

## 2018-12-12 ENCOUNTER — Ambulatory Visit (INDEPENDENT_AMBULATORY_CARE_PROVIDER_SITE_OTHER): Payer: PPO | Admitting: Physician Assistant

## 2018-12-12 ENCOUNTER — Encounter: Payer: Self-pay | Admitting: Physician Assistant

## 2018-12-12 ENCOUNTER — Ambulatory Visit
Admission: RE | Admit: 2018-12-12 | Discharge: 2018-12-12 | Disposition: A | Discharge status: Home / Self Care | Payer: PPO | Source: Ambulatory Visit | Attending: Physician Assistant | Admitting: Physician Assistant

## 2018-12-12 ENCOUNTER — Ambulatory Visit
Admission: RE | Admit: 2018-12-12 | Discharge: 2018-12-12 | Disposition: A | Discharge status: Home / Self Care | Payer: PPO | Attending: Physician Assistant | Admitting: Physician Assistant

## 2018-12-12 VITALS — BP 137/67 | HR 80 | Temp 98.2°F | Resp 16 | Ht 68.0 in | Wt 204.0 lb

## 2018-12-12 DIAGNOSIS — J449 Chronic obstructive pulmonary disease, unspecified: Secondary | ICD-10-CM

## 2018-12-12 DIAGNOSIS — Z6831 Body mass index (BMI) 31.0-31.9, adult: Secondary | ICD-10-CM

## 2018-12-12 DIAGNOSIS — R05 Cough: Secondary | ICD-10-CM | POA: Insufficient documentation

## 2018-12-12 DIAGNOSIS — Z01811 Encounter for preprocedural respiratory examination: Secondary | ICD-10-CM

## 2018-12-12 DIAGNOSIS — I10 Essential (primary) hypertension: Secondary | ICD-10-CM | POA: Diagnosis not present

## 2018-12-12 DIAGNOSIS — J418 Mixed simple and mucopurulent chronic bronchitis: Secondary | ICD-10-CM | POA: Insufficient documentation

## 2018-12-12 NOTE — Progress Notes (Signed)
Patient: Erik Gibbs Male    DOB: Jun 07, 1935   84 y.o.   MRN: [id number] Visit Date: 12/12/2018  Today's Provider: Mar Daring, PA-C   Chief Complaint  Patient presents with  . Pre-op Exam   Subjective:     HPI   Patient is here for pre-op exam. Patient is having TRANSURETHRAL RESECTION OF BLADDER TUMOR WITH Gemcitabine and CYSTOSCOPY WITH RETROGRADE PYELOGRAM on 12/26/2018.  Allergies  Allergen Reactions  . Amlodipine     BP drops severly  . Nitrofuran Derivatives     Unknown reaction  . Povidone-Iodine      Current Outpatient Medications:  .  apalutamide (ERLEADA) 60 MG tablet, Take 4 tablets (240 mg total) by mouth daily. May be taken with or without food. Swallow tablets whole., Disp: 4 tablet, Rfl: 11 .  aspirin 81 MG tablet, Take 81 mg by mouth daily.  , Disp: , Rfl:  .  budesonide-formoterol (SYMBICORT) 160-4.5 MCG/ACT inhaler, Inhale 2 puffs into the lungs 2 (two) times daily. (Patient taking differently: Inhale 2 puffs into the lungs daily. ), Disp: 3 Inhaler, Rfl: 3 .  Cholecalciferol 25 MCG (1000 UT) tablet, Take 1,000 Units by mouth daily. , Disp: , Rfl:  .  cilostazol (PLETAL) 100 MG tablet, Take 1 tablet (100 mg total) by mouth 2 (two) times daily., Disp: 180 tablet, Rfl: 3 .  Cyanocobalamin (B-12 PO), Take 1,000 mcg by mouth daily. , Disp: , Rfl:  .  docusate sodium (COLACE) 50 MG capsule, Take 50 mg by mouth 2 (two) times daily. , Disp: , Rfl:  .  famotidine (PEPCID) 20 MG tablet, Take 1 tablet (20 mg total) by mouth 2 (two) times daily., Disp: 180 tablet, Rfl: 3 .  FREESTYLE LITE test strip, USE 1 STRIP TO CHECK GLUCOSE TWICE DAILY AND  AS  NEEDED, Disp: 100 each, Rfl: 0 .  gabapentin (NEURONTIN) 300 MG capsule, TAKE ONE CAPSULE BY MOUTH AT BEDTIME (Patient taking differently: Take 300 mg by mouth at bedtime. ), Disp: 90 capsule, Rfl: 3 .  insulin NPH-insulin regular (NOVOLIN 70/30) (70-30) 100 UNIT/ML injection, Inject 35 Units into the skin 2  (two) times daily with a meal. , Disp: , Rfl:  .  ipratropium-albuterol (DUONEB) 0.5-2.5 (3) MG/3ML SOLN, Take 3 mLs by nebulization every 4 (four) hours as needed. (Patient taking differently: Take 3 mLs by nebulization 4 (four) times daily. 07:00, 10:00, 15:00, 21:00), Disp: 1080 mL, Rfl: 3 .  iron polysaccharides (NIFEREX) 150 MG capsule, Take 150 mg by mouth 3 (three) times daily., Disp: , Rfl:  .  Lancets (FREESTYLE) lancets, USE 1  TO CHECK GLUCOSE TWICE DAILY, Disp: 200 each, Rfl: 5 .  lisinopril (PRINIVIL,ZESTRIL) 10 MG tablet, Take 10 mg by mouth daily., Disp: , Rfl:  .  loratadine (CLARITIN) 10 MG tablet, Take 10 mg by mouth daily.  , Disp: , Rfl:  .  montelukast (SINGULAIR) 10 MG tablet, TAKE 1 TABLET BY MOUTH AT BEDTIME (Patient taking differently: Take 10 mg by mouth at bedtime. ), Disp: 90 tablet, Rfl: 1 .  niacin 500 MG CR capsule, Take 500 mg by mouth every evening. , Disp: , Rfl:  .  Omega-3 Fatty Acids (FISH OIL) 1200 MG CAPS, Take 1,200 mg by mouth daily. , Disp: , Rfl:  .  pyridOXINE (VITAMIN B-6) 100 MG tablet, Take 200 mg by mouth daily. , Disp: , Rfl:  .  simvastatin (ZOCOR) 20 MG tablet, Take 1 tablet (  20 mg total) by mouth daily. (Patient taking differently: Take 20 mg by mouth every evening. ), Disp: 90 tablet, Rfl: 3 .  torsemide (DEMADEX) 10 MG tablet, Take 10 mg by mouth daily. , Disp: , Rfl: 6  Current Facility-Administered Medications:  .  gemcitabine (GEMZAR) 2,000 mg in sodium chloride irrigation 0.9 % chemo infusion, 2,000 mg, Irrigation, Once, Hollice Espy, MD  Review of Systems  Constitutional: Negative for appetite change, chills and fever.  Respiratory: Negative for chest tightness, shortness of breath and wheezing.   Cardiovascular: Negative for chest pain and palpitations.  Gastrointestinal: Negative for abdominal pain, nausea and vomiting.  Neurological: Negative.     Social History   Tobacco Use  . Smoking status: Former Smoker    Packs/day:  3.00    Years: 62.00    Pack years: 186.00    Types: Cigarettes    Last attempt to quit: 08/08/2010    Years since quitting: 8.3  . Smokeless tobacco: Never Used  Substance Use Topics  . Alcohol use: No    Comment: former heavy alcohol use      Objective:   BP 137/67 (BP Location: Right Arm, Patient Position: Sitting, Cuff Size: Large)   Pulse 80   Temp 98.2 F (36.8 C) (Oral)   Resp 16   Ht 5\' 8"  (1.727 m)   Wt 204 lb (92.5 kg)   SpO2 94%   BMI 31.02 kg/m  Vitals:   12/12/18 1112  BP: 137/67  Pulse: 80  Resp: 16  Temp: 98.2 F (36.8 C)  TempSrc: Oral  SpO2: 94%  Weight: 204 lb (92.5 kg)  Height: 5\' 8"  (1.727 m)     Physical Exam Vitals signs reviewed.  Constitutional:      General: He is not in acute distress.    Appearance: He is well-developed. He is not diaphoretic.  HENT:     Head: Normocephalic and atraumatic.  Neck:     Musculoskeletal: Normal range of motion and neck supple.     Thyroid: No thyromegaly.     Vascular: No JVD.     Trachea: No tracheal deviation.  Cardiovascular:     Rate and Rhythm: Normal rate and regular rhythm.     Heart sounds: Normal heart sounds. No murmur. No friction rub. No gallop.   Pulmonary:     Effort: Pulmonary effort is normal. No respiratory distress.     Breath sounds: Examination of the right-middle field reveals rhonchi. Examination of the right-lower field reveals rhonchi. Rhonchi present. No wheezing or rales.  Lymphadenopathy:     Cervical: No cervical adenopathy.        Assessment & Plan    1. Pre-op chest exam EKG today shows NSR with poor R wave progression secondary to COPD, no ST changes rate of 78. CXR obtained today. Labs recently done in November and were unremarkable. Seeing Dr. Candiss Norse, nephrology, on Thursday and will have renal function rechecked. Risk of postoperative complications including MI are borderline high at 0.5%, putting patient in a moderate risk category for surgery. He is medically  optimized to the best of my knowledge at this time. - EKG 12-Lead - DG Chest 2 View; Future  2. Mixed simple and mucopurulent chronic bronchitis ([place]) CXR ordered prior to bladder biopsy. Continue nebulizers, inhalers as prescribed.  - DG Chest 2 View; Future  3. CAFL (chronic airflow limitation) ([place]) See above medical treatment plan. - DG Chest 2 View; Future  4. BMI 31.0-31.9,adult Counseled patient on healthy  lifestyle modifications including dieting and exercise.      Mar Daring, PA-C  [facility]

## 2018-12-13 NOTE — Telephone Encounter (Addendum)
Notified wife that patient should hold aspirin 81mg  beginning 12/19/2018 & Pletal beginning 12/22/2018 prior to surgery scheduled 12/26/2018 per clearance obtained from patient's PCP, Fenton Malling, PA. Questions answered. Wife expresses understanding of instructions.

## 2018-12-15 ENCOUNTER — Other Ambulatory Visit: Payer: Self-pay

## 2018-12-15 ENCOUNTER — Other Ambulatory Visit: Payer: Self-pay | Admitting: Radiology

## 2018-12-15 ENCOUNTER — Encounter
Admission: RE | Admit: 2018-12-15 | Discharge: 2018-12-15 | Disposition: A | Discharge status: Home / Self Care | Payer: PPO | Source: Ambulatory Visit | Attending: Urology | Admitting: Urology

## 2018-12-15 DIAGNOSIS — I129 Hypertensive chronic kidney disease with stage 1 through stage 4 chronic kidney disease, or unspecified chronic kidney disease: Secondary | ICD-10-CM | POA: Diagnosis not present

## 2018-12-15 DIAGNOSIS — D631 Anemia in chronic kidney disease: Secondary | ICD-10-CM | POA: Diagnosis not present

## 2018-12-15 DIAGNOSIS — N184 Chronic kidney disease, stage 4 (severe): Secondary | ICD-10-CM | POA: Diagnosis not present

## 2018-12-15 DIAGNOSIS — E1129 Type 2 diabetes mellitus with other diabetic kidney complication: Secondary | ICD-10-CM | POA: Diagnosis not present

## 2018-12-15 HISTORY — DX: Chronic kidney disease, unspecified: N18.9

## 2018-12-15 HISTORY — DX: Dyspnea, unspecified: R06.00

## 2018-12-15 HISTORY — DX: Myoneural disorder, unspecified: G70.9

## 2018-12-15 NOTE — Patient Instructions (Signed)
Your procedure is scheduled on: Monday 12/26/18.  Report to DAY SURGERY DEPARTMENT LOCATED ON 2ND FLOOR MEDICAL MALL ENTRANCE. To find out your arrival time please call [phone] between 1PM - 3PM on Friday 12/23/18.   Remember: Instructions that are not followed completely may result in serious medical risk, up to and including death, or upon the discretion of your surgeon and anesthesiologist your surgery may need to be rescheduled.      _X__ 1. Do not eat food after midnight the night before your procedure.                 No gum chewing or hard candies. You may drink water up to 2 hours                 before you are scheduled to arrive for your surgery- DO NOT drink clear                 liquids within 2 hours of the start of your surgery.                   __X__2.  On the morning of surgery brush your teeth with toothpaste and water, you may rinse your mouth with mouthwash if you wish.  Do not swallow any toothpaste or mouthwash. (Clean your mouth)     __X__3.  Notify your doctor if there is any change in your medical condition      (cold, fever, infections).     Do not wear jewelry, make-up, hairpins, clips or nail polish. Do not wear lotions, powders, or perfumes.  Do not shave 48 hours prior to surgery. Men may shave face and neck. Do not bring valuables to the hospital.    [facility] is not responsible for any belongings or valuables.   Contacts, dentures/partials or body piercings may not be worn into surgery. Bring a case for your contacts, glasses or hearing aids, a denture cup will be supplied.   Patients discharged the day of surgery will not be allowed to drive home.    Please read over the following fact sheets that you were given:   MRSA Information   __X__ Take these medicines the morning of surgery with A SIP OF WATER:     1. apalutamide (ERLEADA) 60 MG tablet  2. famotidine (PEPCID) 20 MG tablet  3. loratadine (CLARITIN) 10 MG tablet  4.  budesonide-formoterol (SYMBICORT) 160-4.5 MCG/ACT inhaler  5. ipratropium-albuterol (DUONEB) 0.5-2.5 (3) MG/3ML SOLN      __ X__ Use inhalers on the day of surgery. Also bring the inhaler with you to the hospital on the morning of surgery.   __X__ Take 1/2 your usual insulin dose the night before your surgery. Do not take any insulin on the morning of your surgery.   __X__ Stop Blood Thinners: Pletal: Stop Thursday 12/22/18 until after your surgery.  Stop Aspirin on Sunday 12/18/18.   __X__ Stop Anti-inflammatories 7 days before surgery such as Advil, Ibuprofen, Motrin, BC or Goodies Powder, Naprosyn, Naproxen, Aleve, Aspirin, Meloxicam. May take Tylenol if needed for pain or discomfort.   __X__ Stop the following herbal supplements:  Omega-3 Fatty Acids (FISH OIL) 1200 MG CAPS. Last dose will be on Tuesday 12/20/18.

## 2018-12-19 ENCOUNTER — Other Ambulatory Visit: Payer: Self-pay | Admitting: Physician Assistant

## 2018-12-19 DIAGNOSIS — G629 Polyneuropathy, unspecified: Secondary | ICD-10-CM

## 2018-12-19 DIAGNOSIS — J449 Chronic obstructive pulmonary disease, unspecified: Secondary | ICD-10-CM

## 2018-12-25 MED ORDER — CEFAZOLIN SODIUM-DEXTROSE 2-4 GM/100ML-% IV SOLN
2.0000 g | INTRAVENOUS | Status: AC
  Administered 2018-12-26: 2 g via INTRAVENOUS

## 2018-12-26 ENCOUNTER — Ambulatory Visit
Admission: RE | Admit: 2018-12-26 | Discharge: 2018-12-26 | Disposition: A | Discharge status: Home / Self Care | Payer: PPO | Attending: Urology | Admitting: Urology

## 2018-12-26 ENCOUNTER — Other Ambulatory Visit: Payer: Self-pay

## 2018-12-26 ENCOUNTER — Encounter
Admission: RE | Disposition: A | Discharge status: Home / Self Care | Payer: Self-pay | Source: Home / Self Care | Attending: Urology

## 2018-12-26 ENCOUNTER — Ambulatory Visit: Payer: PPO | Admitting: Certified Registered"

## 2018-12-26 DIAGNOSIS — I739 Peripheral vascular disease, unspecified: Secondary | ICD-10-CM | POA: Diagnosis not present

## 2018-12-26 DIAGNOSIS — E1122 Type 2 diabetes mellitus with diabetic chronic kidney disease: Secondary | ICD-10-CM | POA: Diagnosis not present

## 2018-12-26 DIAGNOSIS — N3080 Other cystitis without hematuria: Secondary | ICD-10-CM | POA: Diagnosis not present

## 2018-12-26 DIAGNOSIS — Z9079 Acquired absence of other genital organ(s): Secondary | ICD-10-CM | POA: Diagnosis not present

## 2018-12-26 DIAGNOSIS — K219 Gastro-esophageal reflux disease without esophagitis: Secondary | ICD-10-CM | POA: Diagnosis not present

## 2018-12-26 DIAGNOSIS — E782 Mixed hyperlipidemia: Secondary | ICD-10-CM | POA: Diagnosis not present

## 2018-12-26 DIAGNOSIS — Z8551 Personal history of malignant neoplasm of bladder: Secondary | ICD-10-CM | POA: Diagnosis not present

## 2018-12-26 DIAGNOSIS — I129 Hypertensive chronic kidney disease with stage 1 through stage 4 chronic kidney disease, or unspecified chronic kidney disease: Secondary | ICD-10-CM | POA: Diagnosis not present

## 2018-12-26 DIAGNOSIS — D494 Neoplasm of unspecified behavior of bladder: Secondary | ICD-10-CM | POA: Diagnosis not present

## 2018-12-26 DIAGNOSIS — C61 Malignant neoplasm of prostate: Secondary | ICD-10-CM | POA: Insufficient documentation

## 2018-12-26 DIAGNOSIS — C679 Malignant neoplasm of bladder, unspecified: Secondary | ICD-10-CM | POA: Diagnosis not present

## 2018-12-26 DIAGNOSIS — N183 Chronic kidney disease, stage 3 (moderate): Secondary | ICD-10-CM | POA: Diagnosis not present

## 2018-12-26 DIAGNOSIS — J449 Chronic obstructive pulmonary disease, unspecified: Secondary | ICD-10-CM | POA: Diagnosis not present

## 2018-12-26 HISTORY — PX: CYSTOSCOPY W/ RETROGRADES: SHX1426

## 2018-12-26 HISTORY — PX: TRANSURETHRAL RESECTION OF BLADDER TUMOR WITH MITOMYCIN-C: SHX6459

## 2018-12-26 LAB — GLUCOSE, CAPILLARY
Glucose-Capillary: 155 mg/dL — ABNORMAL HIGH (ref 70–99)
Glucose-Capillary: 168 mg/dL — ABNORMAL HIGH (ref 70–99)

## 2018-12-26 SURGERY — TRANSURETHRAL RESECTION OF BLADDER TUMOR WITH MITOMYCIN-C
Anesthesia: General | Site: Ureter

## 2018-12-26 MED ORDER — IOTHALAMATE MEGLUMINE 43 % IV SOLN
INTRAVENOUS | Status: DC | PRN
  Administered 2018-12-26: 7 mL

## 2018-12-26 MED ORDER — EPHEDRINE SULFATE 50 MG/ML IJ SOLN
INTRAMUSCULAR | Status: DC | PRN
  Administered 2018-12-26: 10 mg via INTRAVENOUS

## 2018-12-26 MED ORDER — LACTATED RINGERS IV SOLN
INTRAVENOUS | Status: DC | PRN
  Administered 2018-12-26: 13:00:00 via INTRAVENOUS

## 2018-12-26 MED ORDER — IPRATROPIUM-ALBUTEROL 0.5-2.5 (3) MG/3ML IN SOLN
3.0000 mL | Freq: Four times a day (QID) | RESPIRATORY_TRACT | Status: DC
  Administered 2018-12-26: 3 mL via RESPIRATORY_TRACT

## 2018-12-26 MED ORDER — PROPOFOL 10 MG/ML IV BOLUS
INTRAVENOUS | Status: AC
  Filled 2018-12-26: qty 40

## 2018-12-26 MED ORDER — SODIUM CHLORIDE 0.9 % IV SOLN: INTRAVENOUS | Status: DC

## 2018-12-26 MED ORDER — ONDANSETRON HCL 4 MG/2ML IJ SOLN
INTRAMUSCULAR | Status: DC | PRN
  Administered 2018-12-26: 4 mg via INTRAVENOUS

## 2018-12-26 MED ORDER — FENTANYL CITRATE (PF) 100 MCG/2ML IJ SOLN: 25.0000 ug | INTRAMUSCULAR | Status: DC | PRN

## 2018-12-26 MED ORDER — IPRATROPIUM-ALBUTEROL 0.5-2.5 (3) MG/3ML IN SOLN
RESPIRATORY_TRACT | Status: AC
  Administered 2018-12-26: 3 mL via RESPIRATORY_TRACT
  Filled 2018-12-26: qty 3

## 2018-12-26 MED ORDER — FENTANYL CITRATE (PF) 100 MCG/2ML IJ SOLN
INTRAMUSCULAR | Status: AC
  Filled 2018-12-26: qty 2

## 2018-12-26 MED ORDER — FENTANYL CITRATE (PF) 100 MCG/2ML IJ SOLN
INTRAMUSCULAR | Status: DC | PRN
  Administered 2018-12-26: 25 ug via INTRAVENOUS

## 2018-12-26 MED ORDER — PROPOFOL 10 MG/ML IV BOLUS
INTRAVENOUS | Status: DC | PRN
  Administered 2018-12-26: 120 mg via INTRAVENOUS

## 2018-12-26 MED ORDER — LIDOCAINE HCL (CARDIAC) PF 100 MG/5ML IV SOSY
PREFILLED_SYRINGE | INTRAVENOUS | Status: DC | PRN
  Administered 2018-12-26: 100 mg via INTRAVENOUS

## 2018-12-26 MED ORDER — GEMCITABINE CHEMO FOR BLADDER INSTILLATION 2000 MG
INTRAVENOUS | Status: DC | PRN
  Administered 2018-12-26: 2000 mg via INTRAVESICAL

## 2018-12-26 MED ORDER — PHENYLEPHRINE HCL 10 MG/ML IJ SOLN
INTRAMUSCULAR | Status: DC | PRN
  Administered 2018-12-26: 100 ug via INTRAVENOUS

## 2018-12-26 MED ORDER — ONDANSETRON HCL 4 MG/2ML IJ SOLN: 4.0000 mg | Freq: Once | INTRAMUSCULAR | Status: DC | PRN

## 2018-12-26 MED ORDER — HYDROCODONE-ACETAMINOPHEN 5-325 MG PO TABS: 1.0000 | ORAL_TABLET | Freq: Four times a day (QID) | ORAL | 0 refills | Status: DC | PRN

## 2018-12-26 MED ORDER — CEFAZOLIN SODIUM-DEXTROSE 2-4 GM/100ML-% IV SOLN
INTRAVENOUS | Status: AC
  Filled 2018-12-26: qty 100

## 2018-12-26 MED ORDER — OXYBUTYNIN CHLORIDE 5 MG PO TABS: 5.0000 mg | ORAL_TABLET | Freq: Three times a day (TID) | ORAL | 0 refills | Status: DC | PRN

## 2018-12-26 MED ORDER — FAMOTIDINE 20 MG PO TABS
ORAL_TABLET | ORAL | Status: AC
  Filled 2018-12-26: qty 1

## 2018-12-26 SURGICAL SUPPLY — 32 items
BAG DRAIN CYSTO-URO LG1000N (MISCELLANEOUS) ×4 IMPLANT
BAG URINE DRAINAGE (UROLOGICAL SUPPLIES) ×4 IMPLANT
BRUSH SCRUB EZ  4% CHG (MISCELLANEOUS) ×2
BRUSH SCRUB EZ 4% CHG (MISCELLANEOUS) ×2 IMPLANT
CATH FOLEY 2WAY  5CC 16FR (CATHETERS) ×2
CATH URETL 5X70 OPEN END (CATHETERS) ×4 IMPLANT
CATH URTH 16FR FL 2W BLN LF (CATHETERS) ×2 IMPLANT
COVER WAND RF STERILE (DRAPES) ×4 IMPLANT
CRADLE LAMINECT ARM (MISCELLANEOUS) ×4 IMPLANT
DRAPE UTILITY 15X26 TOWEL STRL (DRAPES) ×4 IMPLANT
DRSG TELFA 4X3 1S NADH ST (GAUZE/BANDAGES/DRESSINGS) ×4 IMPLANT
ELECT LOOP 22F BIPOLAR SML (ELECTROSURGICAL)
ELECT REM PT RETURN 9FT ADLT (ELECTROSURGICAL)
ELECTRODE LOOP 22F BIPOLAR SML (ELECTROSURGICAL) IMPLANT
ELECTRODE REM PT RTRN 9FT ADLT (ELECTROSURGICAL) IMPLANT
GLOVE BIO SURGEON STRL SZ 6.5 (GLOVE) ×3 IMPLANT
GLOVE BIO SURGEONS STRL SZ 6.5 (GLOVE) ×1
GOWN STRL REUS W/ TWL LRG LVL3 (GOWN DISPOSABLE) ×4 IMPLANT
GOWN STRL REUS W/TWL LRG LVL3 (GOWN DISPOSABLE) ×4
GUIDEWIRE STR DUAL SENSOR (WIRE) ×4 IMPLANT
KIT TURNOVER CYSTO (KITS) ×4 IMPLANT
LOOP CUT BIPOLAR 24F LRG (ELECTROSURGICAL) IMPLANT
NDL SAFETY ECLIPSE 18X1.5 (NEEDLE) ×2 IMPLANT
NEEDLE HYPO 18GX1.5 SHARP (NEEDLE) ×2
PACK CYSTO AR (MISCELLANEOUS) ×4 IMPLANT
SET CYSTO W/LG BORE CLAMP LF (SET/KITS/TRAYS/PACK) ×4 IMPLANT
SET IRRIG Y TYPE TUR BLADDER L (SET/KITS/TRAYS/PACK) ×4 IMPLANT
SOL .9 NS 3000ML IRR  AL (IV SOLUTION) ×2
SOL .9 NS 3000ML IRR UROMATIC (IV SOLUTION) ×2 IMPLANT
SURGILUBE 2OZ TUBE FLIPTOP (MISCELLANEOUS) ×4 IMPLANT
SYRINGE IRR TOOMEY STRL 70CC (SYRINGE) ×4 IMPLANT
WATER STERILE IRR 1000ML POUR (IV SOLUTION) ×4 IMPLANT

## 2018-12-26 NOTE — Interval H&P Note (Signed)
History and Physical Interval Note:  12/26/2018 12:07 PM  Erik Gibbs  has presented today for surgery, with the diagnosis of Bladder cancer  The various methods of treatment have been discussed with the patient and family. After consideration of risks, benefits and other options for treatment, the patient has consented to  Procedure(s): TRANSURETHRAL RESECTION OF BLADDER TUMOR WITH Gemcitabine (N/A) CYSTOSCOPY WITH RETROGRADE PYELOGRAM (Bilateral) as a surgical intervention .  The patient's history has been reviewed, patient examined, no change in status, stable for surgery.  I have reviewed the patient's chart and labs.  Questions were answered to the patient's satisfaction.    RRR CTAB  Hollice Espy

## 2018-12-26 NOTE — Anesthesia Preprocedure Evaluation (Signed)
Anesthesia Evaluation  Patient identified by MRN, date of birth, ID band Patient awake    Reviewed: Allergy & Precautions, H&P , NPO status , Patient's Chart, lab work & pertinent test results, reviewed documented beta blocker date and time   Airway Mallampati: II  TM Distance: >3 FB Neck ROM: full    Dental  (+) Teeth Intact   Pulmonary shortness of breath and with exertion, COPD,  COPD inhaler, former smoker,    Pulmonary exam normal        Cardiovascular Exercise Tolerance: Poor hypertension, On Medications + Peripheral Vascular Disease  (-) CHF Normal cardiovascular exam Rate:Normal     Neuro/Psych  Neuromuscular disease negative psych ROS   GI/Hepatic Neg liver ROS, PUD, GERD  ,  Endo/Other  negative endocrine ROSdiabetes  Renal/GU CRFRenal disease  negative genitourinary   Musculoskeletal   Abdominal   Peds  Hematology  (+) Blood dyscrasia, anemia ,   Anesthesia Other Findings   Reproductive/Obstetrics negative OB ROS                             Anesthesia Physical Anesthesia Plan  ASA: III  Anesthesia Plan: General LMA   Post-op Pain Management:    Induction:   PONV Risk Score and Plan:   Airway Management Planned:   Additional Equipment:   Intra-op Plan:   Post-operative Plan:   Informed Consent: I have reviewed the patients History and Physical, chart, labs and discussed the procedure including the risks, benefits and alternatives for the proposed anesthesia with the patient or authorized representative who has indicated his/her understanding and acceptance.       Plan Discussed with: CRNA  Anesthesia Plan Comments:         Anesthesia Quick Evaluation

## 2018-12-26 NOTE — Anesthesia Post-op Follow-up Note (Signed)
Anesthesia QCDR form completed.        

## 2018-12-26 NOTE — Transfer of Care (Signed)
Immediate Anesthesia Transfer of Care Note  Patient: Erik Gibbs  Procedure(s) Performed: TRANSURETHRAL RESECTION OF BLADDER TUMOR WITH Gemcitabine (N/A Bladder) CYSTOSCOPY WITH RETROGRADE PYELOGRAM (Bilateral Ureter)  Patient Location: PACU  Anesthesia Type:General  Level of Consciousness: awake, alert  and oriented  Airway & Oxygen Therapy: Patient Spontanous Breathing and Patient connected to face mask oxygen  Post-op Assessment: Report given to RN and Post -op Vital signs reviewed and stable  Post vital signs: Reviewed and stable  Last Vitals:  Vitals Value Taken Time  BP    Temp    Pulse    Resp    SpO2      Last Pain:  Vitals:   12/26/18 1218  PainSc: 0-No pain         Complications: No apparent anesthesia complications

## 2018-12-26 NOTE — Op Note (Signed)
Date of procedure: 12/26/18  Preoperative diagnosis:  1. History of bladder cancer 2. Bladder tumor 3. Prostate cancer  Postoperative diagnosis:  1. Same as above  Procedure: 1. TURBT, small 2. Bilateral retrograde pyelogram 3. Instillation of intravesical chemotherapy  Surgeon: Hollice Espy, MD  Anesthesia: General  Complications: None  Intraoperative findings: Prostate surgically absent.  Bladder with radiation changes appreciated.  The left UO somewhat capacious, right UO normal.  Bilateral retrograde pyelogram unremarkable.  Small approximately 0.5 cm papillary raised lesion on right bladder neck excised.  EBL: Minimal  Specimens: Right bladder neck tumor  Drains: 16 French Foley catheter  Indication: Erik Gibbs is a 84 y.o. patient with personal history of prostate cancer and bladder cancer who had evidence of small recurrence on surveillance cystoscopy.  After reviewing the management options for treatment, he elected to proceed with the above surgical procedure(s). We have discussed the potential benefits and risks of the procedure, side effects of the proposed treatment, the likelihood of the patient achieving the goals of the procedure, and any potential problems that might occur during the procedure or recuperation. Informed consent has been obtained.  Description of procedure:  The patient was taken to the operating room and general anesthesia was induced.  The patient was placed in the dorsal lithotomy position, prepped and draped in the usual sterile fashion, and preoperative antibiotics were administered. A preoperative time-out was performed.   A 21 French scope was advanced per urethra into the bladder.  Notably, the prostate was surgically absent.  The bladder itself had some changes consistent with radiation which are nonspecific with some hypervascularity.  The left UO was somewhat capacious and easily cannulating a 5 [place] open-ended ureteral catheter.   A gentle retrograde pyelogram revealed no hydroureteronephrosis or filling defects.  Attention was then turned to the right ureteral orifice which was somewhat stenotic but patent.  I was able to intubate with a 5 [place] open-ended ureteral catheter and the same procedure was performed.  This revealed no filling defects or hydronephrosis on the side either.  Initially, the tumors not easily seen with a 30 degree lens.  Exchanged for 70 degree lens were there at approximately 0.5 cm raised papillary lesion was identified on the right bladder neck.  I then was able to revert back to the 30 degree and with some torquing, the lesion was identified.  Cold cup biopsies were used to completely resect the tumor.  The base of the lesion was then fulgurated using Bugbee electrocautery.  Adequate hemostasis was achieved.  There are no additional tumors within the bladder.  The bladder was then drained.  A 16 French Foley catheter was then placed easily and 10 cc was instilled to the bladder.  The patient was then clean and dry, repositioned supine position cumbersome anesthesia, and taken the PACU in stable condition.  2000 mg of intravesical gemcitabine was instilled to the bladder.  This is led to dwell for 1 hour.  This was well-tolerated.  After an hour, the Foley was unclamped and the chemo was drained.  The Foley was removed.  Plan: I will call the patient with his pathology results.  He has follow-up with me next month with PSA prior for management of his prostate cancer.  Will continue 77-month surveillance cystoscopies.  Hollice Espy, M.D.

## 2018-12-26 NOTE — Discharge Instructions (Signed)
Transurethral Resection of Bladder Tumor (TURBT) or Bladder Biopsy ° ° °Definition: ° Transurethral Resection of the Bladder Tumor is a surgical procedure used to diagnose and remove tumors within the bladder. TURBT is the most common treatment for early stage bladder cancer. ° °General instructions: °   ° Your recent bladder surgery requires very little post hospital care but some definite precautions. ° °Despite the fact that no skin incisions were used, the area around the bladder incisions are raw and covered with scabs to promote healing and prevent bleeding. Certain precautions are needed to insure that the scabs are not disturbed over the next 2-4 weeks while the healing proceeds. ° °Because the raw surface inside your bladder and the irritating effects of urine you may expect frequency of urination and/or urgency (a stronger desire to urinate) and perhaps even getting up at night more often. This will usually resolve or improve slowly over the healing period. You may see some blood in your urine over the first 6 weeks. Do not be alarmed, even if the urine was clear for a while. Get off your feet and drink lots of fluids until clearing occurs. If you start to pass clots or don't improve call us. ° °Diet: ° °You may return to your normal diet immediately. Because of the raw surface of your bladder, alcohol, spicy foods, foods high in acid and drinks with caffeine may cause irritation or frequency and should be used in moderation. To keep your urine flowing freely and avoid constipation, drink plenty of fluids during the day (8-10 glasses). Tip: Avoid cranberry juice because it is very acidic. ° °Activity: ° °Your physical activity doesn't need to be restricted. However, if you are very active, you may see some blood in the urine. We suggest that you reduce your activity under the circumstances until the bleeding has stopped. ° °Bowels: ° °It is important to keep your bowels regular during the postoperative  period. Straining with bowel movements can cause bleeding. A bowel movement every other day is reasonable. Use a mild laxative if needed, such as milk of magnesia 2-3 tablespoons, or 2 Dulcolax tablets. Call if you continue to have problems. If you had been taking narcotics for pain, before, during or after your surgery, you may be constipated. Take a laxative if necessary. ° ° ° °Medication: ° °You should resume your pre-surgery medications unless told not to. In addition you may be given an antibiotic to prevent or treat infection. Antibiotics are not always necessary. All medication should be taken as prescribed until the bottles are finished unless you are having an unusual reaction to one of the drugs. ° ° °[facility] °[place], [place] [id number] °[phone] ° ° ° °AMBULATORY SURGERY  °DISCHARGE INSTRUCTIONS ° ° °1) The drugs that you were given will stay in your system until tomorrow so for the next 24 hours you should not: ° °A) Drive an automobile °B) Make any legal decisions °C) Drink any alcoholic beverage ° ° °2) You may resume regular meals tomorrow.  Today it is better to start with liquids and gradually work up to solid foods. ° °You may eat anything you prefer, but it is better to start with liquids, then soup and crackers, and gradually work up to solid foods. ° ° °3) Please notify your doctor immediately if you have any unusual bleeding, trouble breathing, redness and pain at the surgery site, drainage, fever, or pain not relieved by medication. ° ° ° °4) Additional Instructions: ° ° ° ° ° ° ° °  Please contact your physician with any problems or Same Day Surgery at [phone], Monday through Friday 6 am to 4 pm, or [facility] number at [phone]. ° °

## 2018-12-26 NOTE — Anesthesia Procedure Notes (Signed)
Procedure Name: LMA Insertion Date/Time: 12/26/2018 1:03 PM Performed by: Chanetta Marshall, CRNA Pre-anesthesia Checklist: Patient identified, Emergency Drugs available, Suction available and Patient being monitored Patient Re-evaluated:Patient Re-evaluated prior to induction Oxygen Delivery Method: Circle system utilized Preoxygenation: Pre-oxygenation with 100% oxygen Induction Type: IV induction Ventilation: Mask ventilation without difficulty LMA: LMA inserted LMA Size: 4.0 Number of attempts: 1 Placement Confirmation: positive ETCO2,  breath sounds checked- equal and bilateral and CO2 detector Tube secured with: Tape Dental Injury: Teeth and Oropharynx as per pre-operative assessment

## 2018-12-27 ENCOUNTER — Encounter: Payer: Self-pay | Admitting: Urology

## 2018-12-28 LAB — SURGICAL PATHOLOGY

## 2018-12-29 ENCOUNTER — Telehealth: Payer: Self-pay | Admitting: Family Medicine

## 2018-12-29 NOTE — Telephone Encounter (Signed)
Patient notified

## 2018-12-29 NOTE — Telephone Encounter (Signed)
-----   Message from Hollice Espy, MD sent at 12/29/2018 11:19 AM EST ----- Please let this patient know that his bladder biopsy showed a polyp which looked like an early cancer.  No further additional treatment needed for time being.  Plan for f/u as scheduled. Good news.  Hollice Espy, MD

## 2018-12-30 ENCOUNTER — Ambulatory Visit (INDEPENDENT_AMBULATORY_CARE_PROVIDER_SITE_OTHER): Payer: PPO | Admitting: Physician Assistant

## 2018-12-30 ENCOUNTER — Encounter: Payer: Self-pay | Admitting: Physician Assistant

## 2018-12-30 VITALS — BP 144/71 | HR 64 | Temp 98.1°F | Resp 16 | Wt 203.0 lb

## 2018-12-30 DIAGNOSIS — G629 Polyneuropathy, unspecified: Secondary | ICD-10-CM

## 2018-12-30 DIAGNOSIS — E1121 Type 2 diabetes mellitus with diabetic nephropathy: Secondary | ICD-10-CM | POA: Diagnosis not present

## 2018-12-30 DIAGNOSIS — K219 Gastro-esophageal reflux disease without esophagitis: Secondary | ICD-10-CM | POA: Diagnosis not present

## 2018-12-30 DIAGNOSIS — Z794 Long term (current) use of insulin: Secondary | ICD-10-CM | POA: Diagnosis not present

## 2018-12-30 DIAGNOSIS — I10 Essential (primary) hypertension: Secondary | ICD-10-CM

## 2018-12-30 DIAGNOSIS — E7800 Pure hypercholesterolemia, unspecified: Secondary | ICD-10-CM | POA: Diagnosis not present

## 2018-12-30 LAB — POCT GLYCOSYLATED HEMOGLOBIN (HGB A1C)
Est. average glucose Bld gHb Est-mCnc: 146
Hemoglobin A1C: 6.7 % — AB (ref 4.0–5.6)

## 2018-12-30 MED ORDER — CILOSTAZOL 100 MG PO TABS: 100.0000 mg | ORAL_TABLET | Freq: Two times a day (BID) | ORAL | 3 refills | Status: DC

## 2018-12-30 MED ORDER — FAMOTIDINE 20 MG PO TABS: 20.0000 mg | ORAL_TABLET | Freq: Two times a day (BID) | ORAL | 3 refills | Status: DC

## 2018-12-30 MED ORDER — LISINOPRIL 10 MG PO TABS: 10.0000 mg | ORAL_TABLET | Freq: Every day | ORAL | 3 refills | Status: DC

## 2018-12-30 MED ORDER — SIMVASTATIN 20 MG PO TABS: 20.0000 mg | ORAL_TABLET | Freq: Every evening | ORAL | 3 refills | Status: DC

## 2018-12-30 NOTE — Progress Notes (Signed)
Patient: Erik Gibbs Male    DOB: 04-Apr-1935   84 y.o.   MRN: [id number] Visit Date: 12/30/2018  Today's Provider: Mar Daring, PA-C   Chief Complaint  Patient presents with  . Follow-up    T2DM   Subjective:     HPI  Diabetes Mellitus: Patient presents for follow up of diabetes. Symptoms: none. Symptoms have stabilized. Home sugars: 100's-120's some in the 80's. His disease course has been stable.  Requesting medication refills.    Allergies  Allergen Reactions  . Amlodipine     BP drops severly  . Nitrofuran Derivatives     Unknown reaction  . Povidone-Iodine Rash     Current Outpatient Medications:  .  apalutamide (ERLEADA) 60 MG tablet, Take 4 tablets (240 mg total) by mouth daily. May be taken with or without food. Swallow tablets whole., Disp: 4 tablet, Rfl: 11 .  aspirin 81 MG tablet, Take 81 mg by mouth daily.  , Disp: , Rfl:  .  budesonide-formoterol (SYMBICORT) 160-4.5 MCG/ACT inhaler, Inhale 2 puffs into the lungs 2 (two) times daily. (Patient taking differently: Inhale 1 puff into the lungs daily. Patient takes 1 puff daily.), Disp: 3 Inhaler, Rfl: 3 .  Cholecalciferol 25 MCG (1000 UT) tablet, Take 1,000 Units by mouth daily. , Disp: , Rfl:  .  cilostazol (PLETAL) 100 MG tablet, Take 1 tablet (100 mg total) by mouth 2 (two) times daily., Disp: 180 tablet, Rfl: 3 .  Cyanocobalamin (B-12 PO), Take 1,000 mcg by mouth daily. , Disp: , Rfl:  .  docusate sodium (COLACE) 50 MG capsule, Take 50 mg by mouth 2 (two) times daily. , Disp: , Rfl:  .  famotidine (PEPCID) 20 MG tablet, Take 1 tablet (20 mg total) by mouth 2 (two) times daily., Disp: 180 tablet, Rfl: 3 .  FREESTYLE LITE test strip, USE 1 STRIP TO CHECK GLUCOSE TWICE DAILY AND  AS  NEEDED, Disp: 100 each, Rfl: 0 .  gabapentin (NEURONTIN) 300 MG capsule, TAKE 1 CAPSULE BY MOUTH AT BEDTIME, Disp: 90 capsule, Rfl: 1 .  insulin NPH-insulin regular (NOVOLIN 70/30) (70-30) 100 UNIT/ML injection,  Inject 35 Units into the skin 2 (two) times daily with a meal. , Disp: , Rfl:  .  ipratropium-albuterol (DUONEB) 0.5-2.5 (3) MG/3ML SOLN, USE 1 AMPULE IN NEBULIZER EVERY 4 HOURS AS NEEDED, Disp: 1080 mL, Rfl: 5 .  iron polysaccharides (NIFEREX) 150 MG capsule, Take 150 mg by mouth 3 (three) times daily., Disp: , Rfl:  .  Lancets (FREESTYLE) lancets, USE 1  TO CHECK GLUCOSE TWICE DAILY, Disp: 200 each, Rfl: 5 .  lisinopril (PRINIVIL,ZESTRIL) 10 MG tablet, Take 10 mg by mouth daily., Disp: , Rfl:  .  loratadine (CLARITIN) 10 MG tablet, Take 10 mg by mouth daily.  , Disp: , Rfl:  .  montelukast (SINGULAIR) 10 MG tablet, TAKE 1 TABLET BY MOUTH AT BEDTIME (Patient taking differently: Take 10 mg by mouth at bedtime. ), Disp: 90 tablet, Rfl: 1 .  niacin 500 MG CR capsule, Take 1,000 mg by mouth every evening. , Disp: , Rfl:  .  Omega-3 Fatty Acids (FISH OIL) 1200 MG CAPS, Take 1,200 mg by mouth daily. , Disp: , Rfl:  .  oxybutynin (DITROPAN) 5 MG tablet, Take 1 tablet (5 mg total) by mouth every 8 (eight) hours as needed for bladder spasms., Disp: 10 tablet, Rfl: 0 .  pyridOXINE (VITAMIN B-6) 100 MG tablet, Take 200 mg by  mouth daily. , Disp: , Rfl:  .  simvastatin (ZOCOR) 20 MG tablet, Take 1 tablet (20 mg total) by mouth daily. (Patient taking differently: Take 20 mg by mouth every evening. ), Disp: 90 tablet, Rfl: 3 .  torsemide (DEMADEX) 10 MG tablet, Take 10 mg by mouth daily. , Disp: , Rfl: 6 .  HYDROcodone-acetaminophen (NORCO/VICODIN) 5-325 MG tablet, Take 1-2 tablets by mouth every 6 (six) hours as needed for moderate pain. (Patient not taking: Reported on 12/30/2018), Disp: 6 tablet, Rfl: 0  Current Facility-Administered Medications:  .  gemcitabine (GEMZAR) 2,000 mg in sodium chloride irrigation 0.9 % chemo infusion, 2,000 mg, Irrigation, Once, Hollice Espy, MD  Review of Systems  Constitutional: Negative.   Respiratory: Negative.   Cardiovascular: Negative.   Gastrointestinal: Negative.     Genitourinary: Negative.   Neurological: Negative.     Social History   Tobacco Use  . Smoking status: Former Smoker    Packs/day: 3.00    Years: 62.00    Pack years: 186.00    Types: Cigarettes    Last attempt to quit: 08/08/2010    Years since quitting: 8.4  . Smokeless tobacco: Never Used  Substance Use Topics  . Alcohol use: No    Comment: former heavy alcohol use      Objective:   BP (!) 144/71 (BP Location: Right Arm, Patient Position: Sitting, Cuff Size: Large)   Pulse 64   Temp 98.1 F (36.7 C) (Oral)   Resp 16   Wt 203 lb (92.1 kg)   BMI 30.87 kg/m  Vitals:   12/30/18 0801  BP: (!) 144/71  Pulse: 64  Resp: 16  Temp: 98.1 F (36.7 C)  TempSrc: Oral  Weight: 203 lb (92.1 kg)     Physical Exam Vitals signs reviewed.  Constitutional:      General: He is not in acute distress.    Appearance: He is well-developed. He is not diaphoretic.  HENT:     Head: Normocephalic and atraumatic.  Neck:     Musculoskeletal: Normal range of motion and neck supple.     Thyroid: No thyromegaly.     Vascular: No JVD.     Trachea: No tracheal deviation.  Cardiovascular:     Rate and Rhythm: Normal rate and regular rhythm.     Heart sounds: Normal heart sounds. No murmur. No friction rub. No gallop.   Pulmonary:     Effort: Pulmonary effort is normal. No respiratory distress.     Breath sounds: Wheezing (some fine wheezes) present. No rales.  Lymphadenopathy:     Cervical: No cervical adenopathy.        Assessment & Plan    1. Type 2 diabetes mellitus with diabetic nephropathy, with long-term current use of insulin (HCC) A1c stable at 6.7. Continue Novolin 70/30 35 units BID. - POCT glycosylated hemoglobin (Hb A1C) - cilostazol (PLETAL) 100 MG tablet; Take 1 tablet (100 mg total) by mouth 2 (two) times daily.  Dispense: 180 tablet; Refill: 3  2. Pure hypercholesterolemia Stable. Diagnosis pulled for medication refill. Continue current medical treatment  plan. - simvastatin (ZOCOR) 20 MG tablet; Take 1 tablet (20 mg total) by mouth every evening.  Dispense: 90 tablet; Refill: 3  3. Neuropathy Stable. Diagnosis pulled for medication refill. Continue current medical treatment plan. - cilostazol (PLETAL) 100 MG tablet; Take 1 tablet (100 mg total) by mouth 2 (two) times daily.  Dispense: 180 tablet; Refill: 3  4. Gastroesophageal reflux disease, esophagitis presence not specified  Stable. Diagnosis pulled for medication refill. Continue current medical treatment plan. - famotidine (PEPCID) 20 MG tablet; Take 1 tablet (20 mg total) by mouth 2 (two) times daily.  Dispense: 180 tablet; Refill: 3  5. Essential hypertension Stable. Diagnosis pulled for medication refill. Continue current medical treatment plan. - lisinopril (PRINIVIL,ZESTRIL) 10 MG tablet; Take 1 tablet (10 mg total) by mouth daily.  Dispense: 90 tablet; Refill: [place], PA-C  [facility]

## 2018-12-30 NOTE — Anesthesia Postprocedure Evaluation (Signed)
Anesthesia Post Note  Patient: Erik Gibbs  Procedure(s) Performed: TRANSURETHRAL RESECTION OF BLADDER TUMOR WITH Gemcitabine (N/A Bladder) CYSTOSCOPY WITH RETROGRADE PYELOGRAM (Bilateral Ureter)  Patient location during evaluation: PACU Anesthesia Type: General Level of consciousness: awake and alert Pain management: pain level controlled Vital Signs Assessment: post-procedure vital signs reviewed and stable Respiratory status: spontaneous breathing, nonlabored ventilation, respiratory function stable and patient connected to nasal cannula oxygen Cardiovascular status: blood pressure returned to baseline and stable Postop Assessment: no apparent nausea or vomiting Anesthetic complications: no     Last Vitals:  Vitals:   12/26/18 1508 12/26/18 1545  BP: 138/61 118/63  Pulse: 77   Resp:  16  Temp: 36.8 C   SpO2: 94% 93%    Last Pain:  Vitals:   12/27/18 0851  TempSrc:   PainSc: 0-No pain                 Molli Barrows

## 2019-01-29 ENCOUNTER — Other Ambulatory Visit: Payer: Self-pay | Admitting: Family Medicine

## 2019-01-29 DIAGNOSIS — Z794 Long term (current) use of insulin: Principal | ICD-10-CM

## 2019-01-29 DIAGNOSIS — E1121 Type 2 diabetes mellitus with diabetic nephropathy: Secondary | ICD-10-CM

## 2019-01-31 ENCOUNTER — Other Ambulatory Visit: Payer: Self-pay | Admitting: Physician Assistant

## 2019-01-31 DIAGNOSIS — J449 Chronic obstructive pulmonary disease, unspecified: Secondary | ICD-10-CM

## 2019-01-31 NOTE — Telephone Encounter (Signed)
Patient will be out of this today and would like this sent in today if possible.  This is Jenni's patient.

## 2019-02-06 ENCOUNTER — Other Ambulatory Visit: Payer: PPO

## 2019-02-06 ENCOUNTER — Other Ambulatory Visit: Payer: Self-pay

## 2019-02-06 DIAGNOSIS — C61 Malignant neoplasm of prostate: Secondary | ICD-10-CM

## 2019-02-07 LAB — PSA: Prostate Specific Ag, Serum: <0.1 ng/mL (ref 0.0–4.0)

## 2019-02-08 ENCOUNTER — Ambulatory Visit: Payer: PPO | Admitting: Urology

## 2019-02-08 ENCOUNTER — Encounter: Payer: Self-pay | Admitting: Urology

## 2019-02-08 ENCOUNTER — Other Ambulatory Visit: Payer: Self-pay

## 2019-02-08 VITALS — BP 139/71 | HR 91 | Ht 68.0 in | Wt 201.0 lb

## 2019-02-08 DIAGNOSIS — Z8551 Personal history of malignant neoplasm of bladder: Secondary | ICD-10-CM | POA: Diagnosis not present

## 2019-02-08 DIAGNOSIS — C61 Malignant neoplasm of prostate: Secondary | ICD-10-CM | POA: Diagnosis not present

## 2019-02-08 NOTE — Progress Notes (Signed)
02/08/2019 3:26 PM   Erik Gibbs 07-07-1935 [id number]  Referring provider: Mar Daring, PA-C [place], [place] [address]  Chief Complaint  Patient presents with  . Prostate Cancer    2 months    HPI: 84 year old male with a personal history of nonmetastatic castrate resistant prostate cancer status post prostatectomy/ orchiectomy with rising PSA.  He is started on apalutamide with a downward trending PSA.  Recent PSA on 02/06/2019 undetectable, less than 0.1.  He also has a personal history of bladder cancer. This was diagnosed in 2007 with unknown pathology although Dr. Bjorn Loser notes indicate CIS of the bladder. It also appears that he had a low-grade noninvasive urothelial carcinoma recurrence in 2018. He did a biopsy performed in the office 04/2018 which was benign.    More recently, he returned to the operating room on 12/26/2018 where the left UO was noted to be capacious.  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  He denies any issues today.  He has no voiding symptoms.     PMH: Past Medical History:  Diagnosis Date  . Anemia   . Chronic kidney disease   . COPD (chronic obstructive pulmonary disease) ([place])   . Diabetes mellitus   . Dyspnea   . GERD (gastroesophageal reflux disease)   . Hyperlipidemia, mixed   . Hypertension   . Neuromuscular disorder ([place])   . PVD (peripheral vascular disease) [facility])     Surgical History: Past Surgical History:  Procedure Laterality Date  . BLADDER TUMOR EXCISION  2007  . CYSTOSCOPY W/ RETROGRADES Bilateral 12/26/2018   Procedure: CYSTOSCOPY WITH RETROGRADE PYELOGRAM;  Surgeon: Hollice Espy, MD;  Location: [facility] ORS;  Service: Urology;  Laterality: Bilateral;  . EYE SURGERY Bilateral    Cataract  . PROSTATECTOMY  1995  . TESTICLE REMOVAL Bilateral    Dr. August Saucer  . TRANSURETHRAL  RESECTION OF BLADDER TUMOR WITH MITOMYCIN-C N/A 12/26/2018   Procedure: TRANSURETHRAL RESECTION OF BLADDER TUMOR WITH Gemcitabine;  Surgeon: Hollice Espy, MD;  Location: [facility] ORS;  Service: Urology;  Laterality: N/A;    Home Medications:  Allergies as of 02/08/2019      Reactions   Amlodipine    BP drops severly   Nitrofuran Derivatives    Unknown reaction   Povidone-iodine Rash      Medication List       Accurate as of February 08, 2019 11:59 PM. Always use your most recent med list.        apalutamide 60 MG tablet Commonly known as:  ERLEADA Take 4 tablets (240 mg total) by mouth daily. May be taken with or without food. Swallow tablets whole.   aspirin 81 MG tablet Take 81 mg by mouth daily.   B-12 PO Take 1,000 mcg by mouth daily.   budesonide-formoterol 160-4.5 MCG/ACT inhaler Commonly known as:  SYMBICORT Inhale 2 puffs by mouth twice daily   Cholecalciferol 25 MCG (1000 UT) tablet Take 1,000 Units by mouth daily.   cilostazol 100 MG tablet Commonly known as:  PLETAL Take 1 tablet (100 mg total) by mouth 2 (two) times daily.   docusate sodium 50 MG capsule Commonly known as:  COLACE Take 50 mg by mouth 2 (two) times daily.   famotidine 20 MG tablet Commonly known as:  PEPCID Take 1 tablet (20 mg total) by mouth 2 (two) times daily.   Fish Oil 1200 MG  Caps Take 1,200 mg by mouth daily.   freestyle lancets USE 1  TO CHECK GLUCOSE TWICE DAILY   FREESTYLE LITE test strip Generic drug:  glucose blood USE 1 STRIP TO CHECK GLUCOSE TWICE DAILY AS NEEDED   gabapentin 300 MG capsule Commonly known as:  NEURONTIN TAKE 1 CAPSULE BY MOUTH AT BEDTIME   insulin NPH-regular Human (70-30) 100 UNIT/ML injection Inject 35 Units into the skin 2 (two) times daily with a meal.   ipratropium-albuterol 0.5-2.5 (3) MG/3ML Soln Commonly known as:  DUONEB USE 1 AMPULE IN NEBULIZER EVERY 4 HOURS AS NEEDED   iron polysaccharides 150 MG capsule Commonly known as:  NIFEREX  Take 150 mg by mouth 3 (three) times daily.   lisinopril 10 MG tablet Commonly known as:  PRINIVIL,ZESTRIL Take 1 tablet (10 mg total) by mouth daily.   loratadine 10 MG tablet Commonly known as:  CLARITIN Take 10 mg by mouth daily.   montelukast 10 MG tablet Commonly known as:  SINGULAIR TAKE 1 TABLET BY MOUTH AT BEDTIME   niacin 500 MG CR capsule Take 1,000 mg by mouth every evening.   pyridOXINE 100 MG tablet Commonly known as:  VITAMIN B-6 Take 200 mg by mouth daily.   simvastatin 20 MG tablet Commonly known as:  ZOCOR Take 1 tablet (20 mg total) by mouth every evening.   torsemide 10 MG tablet Commonly known as:  DEMADEX Take 10 mg by mouth daily.       Allergies:  Allergies  Allergen Reactions  . Amlodipine     BP drops severly  . Nitrofuran Derivatives     Unknown reaction  . Povidone-Iodine Rash    Family History: Family History  Problem Relation Age of Onset  . COPD Brother   . Diabetes Brother   . Heart attack Brother   . Emphysema Father   . Cancer Father        prostate  . Depression Mother   . Heart disease Mother   . Ulcers Mother   . Diabetes Other   . Heart failure Other   . Asthma Son        childhood    Social History:  reports that he quit smoking about 8 years ago. His smoking use included cigarettes. He has a 186.00 pack-year smoking history. He has never used smokeless tobacco. He reports that he does not drink alcohol or use drugs.  ROS: UROLOGY Frequent Urination?: No Hard to postpone urination?: No Burning/pain with urination?: No Get up at night to urinate?: No Leakage of urine?: No Urine stream starts and stops?: No Trouble starting stream?: No Do you have to strain to urinate?: No Blood in urine?: No Urinary tract infection?: No Sexually transmitted disease?: No Injury to kidneys or bladder?: No Painful intercourse?: No Weak stream?: No Erection problems?: No Penile pain?: No  Gastrointestinal Nausea?: No  Vomiting?: No Indigestion/heartburn?: No Diarrhea?: No Constipation?: No  Constitutional Fever: No Night sweats?: No Weight loss?: No Fatigue?: No  Skin Skin rash/lesions?: No Itching?: No  Eyes Blurred vision?: No Double vision?: No  Ears/Nose/Throat Sore throat?: No Sinus problems?: No  Hematologic/Lymphatic Swollen glands?: No Easy bruising?: Yes  Cardiovascular Leg swelling?: No Chest pain?: No  Respiratory Cough?: Yes Shortness of breath?: Yes  Endocrine Excessive thirst?: No  Musculoskeletal Back pain?: No Joint pain?: No  Neurological Headaches?: No Dizziness?: No  Psychologic Depression?: No Anxiety?: No  Physical Exam: BP 139/71   Pulse 91   Ht 5\' 8"  (1.727  m)   Wt 201 lb (91.2 kg)   BMI 30.56 kg/m   Constitutional:  Alert and oriented, No acute distress. HEENT: [place] AT, moist mucus membranes.  Trachea midline, no masses. Cardiovascular: No clubbing, cyanosis, or edema. Respiratory: Normal respiratory effort, no increased work of breathing. GI: Abdomen is soft, nontender, nondistended, no abdominal masses Skin: No rashes, bruises or suspicious lesions. Neurologic: Grossly intact, no focal deficits, moving all 4 extremities. Psychiatric: Normal mood and affect.  Laboratory Data: Lab Results  Component Value Date   WBC 6.3 09/28/2018   HGB 10.6 (L) 09/28/2018   HCT 31.5 (L) 09/28/2018   MCV 96 09/28/2018   PLT 229 09/28/2018    Lab Results  Component Value Date   CREATININE 2.37 (H) 09/28/2018    Lab Results  Component Value Date   HGBA1C 6.7 (A) 12/30/2018    Assessment & Plan:    1. History of bladder cancer We discussed surgical pathology results in detail today again Presence of atypia but no carcinoma Will continue surveillance cystoscopy on a every 3 month basis, will be due in 2 months from now  2. Prostate cancer [facility] Personal history of castrate resistant prostate cancer Doing well on apalutamide, no side  effects or symptoms Continue to trend PSA  Return in about 2 months (around 04/10/2019) for cysto.  Hollice Espy, MD  [facility] [address] [place], [place] [address] [phone]

## 2019-02-09 ENCOUNTER — Ambulatory Visit: Payer: PPO | Admitting: Urology

## 2019-04-05 ENCOUNTER — Encounter: Payer: Self-pay | Admitting: Physician Assistant

## 2019-04-05 ENCOUNTER — Ambulatory Visit (INDEPENDENT_AMBULATORY_CARE_PROVIDER_SITE_OTHER): Payer: PPO | Admitting: Physician Assistant

## 2019-04-05 ENCOUNTER — Other Ambulatory Visit: Payer: Self-pay

## 2019-04-05 VITALS — BP 150/72 | HR 76 | Temp 98.2°F | Resp 16 | Wt 199.0 lb

## 2019-04-05 DIAGNOSIS — E1121 Type 2 diabetes mellitus with diabetic nephropathy: Secondary | ICD-10-CM | POA: Diagnosis not present

## 2019-04-05 DIAGNOSIS — J449 Chronic obstructive pulmonary disease, unspecified: Secondary | ICD-10-CM

## 2019-04-05 DIAGNOSIS — Z794 Long term (current) use of insulin: Secondary | ICD-10-CM

## 2019-04-05 DIAGNOSIS — I10 Essential (primary) hypertension: Secondary | ICD-10-CM | POA: Diagnosis not present

## 2019-04-05 DIAGNOSIS — I130 Hypertensive heart and chronic kidney disease with heart failure and stage 1 through stage 4 chronic kidney disease, or unspecified chronic kidney disease: Secondary | ICD-10-CM

## 2019-04-05 MED ORDER — LISINOPRIL 10 MG PO TABS: 10.0000 mg | ORAL_TABLET | Freq: Every day | ORAL | 3 refills | Status: DC

## 2019-04-05 MED ORDER — MONTELUKAST SODIUM 10 MG PO TABS: 10.0000 mg | ORAL_TABLET | Freq: Every day | ORAL | 1 refills | Status: DC

## 2019-04-05 MED ORDER — TORSEMIDE 10 MG PO TABS: 10.0000 mg | ORAL_TABLET | Freq: Every day | ORAL | 1 refills | Status: DC

## 2019-04-05 NOTE — Progress Notes (Signed)
Patient: Erik Gibbs Male    DOB: 1935/08/13   84 y.o.   MRN: [id number] Visit Date: 04/05/2019  Today's Provider: Mar Daring, PA-C   Chief Complaint  Patient presents with  . Follow-up    T2DM   Subjective:     HPI  Diabetes Mellitus Type II, Follow-up: Patient here for follow-up of Type 2 diabetes mellitus.  Current symptoms/problems include none and have been stable. Current diabetic medications include Novolin 70/30 35 units BID.   Home blood sugar records: fasting range: 100's-110'   Allergies  Allergen Reactions  . Amlodipine     BP drops severly  . Nitrofuran Derivatives     Unknown reaction  . Povidone-Iodine Rash     Current Outpatient Medications:  .  apalutamide (ERLEADA) 60 MG tablet, Take 4 tablets (240 mg total) by mouth daily. May be taken with or without food. Swallow tablets whole., Disp: 4 tablet, Rfl: 11 .  aspirin 81 MG tablet, Take 81 mg by mouth daily.  , Disp: , Rfl:  .  budesonide-formoterol (SYMBICORT) 160-4.5 MCG/ACT inhaler, Inhale 2 puffs by mouth twice daily, Disp: 11 g, Rfl: 0 .  Cholecalciferol 25 MCG (1000 UT) tablet, Take 1,000 Units by mouth daily. , Disp: , Rfl:  .  cilostazol (PLETAL) 100 MG tablet, Take 1 tablet (100 mg total) by mouth 2 (two) times daily., Disp: 180 tablet, Rfl: 3 .  Cyanocobalamin (B-12 PO), Take 1,000 mcg by mouth daily. , Disp: , Rfl:  .  docusate sodium (COLACE) 50 MG capsule, Take 50 mg by mouth 2 (two) times daily. , Disp: , Rfl:  .  famotidine (PEPCID) 20 MG tablet, Take 1 tablet (20 mg total) by mouth 2 (two) times daily., Disp: 180 tablet, Rfl: 3 .  FREESTYLE LITE test strip, USE 1 STRIP TO CHECK GLUCOSE TWICE DAILY AS NEEDED, Disp: 200 each, Rfl: 3 .  gabapentin (NEURONTIN) 300 MG capsule, TAKE 1 CAPSULE BY MOUTH AT BEDTIME, Disp: 90 capsule, Rfl: 1 .  insulin NPH-insulin regular (NOVOLIN 70/30) (70-30) 100 UNIT/ML injection, Inject 35 Units into the skin 2 (two) times daily with a meal. ,  Disp: , Rfl:  .  ipratropium-albuterol (DUONEB) 0.5-2.5 (3) MG/3ML SOLN, USE 1 AMPULE IN NEBULIZER EVERY 4 HOURS AS NEEDED, Disp: 1080 mL, Rfl: 5 .  iron polysaccharides (NIFEREX) 150 MG capsule, Take 150 mg by mouth 3 (three) times daily., Disp: , Rfl:  .  Lancets (FREESTYLE) lancets, USE 1  TO CHECK GLUCOSE TWICE DAILY, Disp: 200 each, Rfl: 5 .  lisinopril (PRINIVIL,ZESTRIL) 10 MG tablet, Take 1 tablet (10 mg total) by mouth daily., Disp: 90 tablet, Rfl: 3 .  loratadine (CLARITIN) 10 MG tablet, Take 10 mg by mouth daily.  , Disp: , Rfl:  .  montelukast (SINGULAIR) 10 MG tablet, TAKE 1 TABLET BY MOUTH AT BEDTIME (Patient taking differently: Take 10 mg by mouth at bedtime. ), Disp: 90 tablet, Rfl: 1 .  niacin 500 MG CR capsule, Take 1,000 mg by mouth every evening. , Disp: , Rfl:  .  Omega-3 Fatty Acids (FISH OIL) 1200 MG CAPS, Take 1,200 mg by mouth daily. , Disp: , Rfl:  .  pyridOXINE (VITAMIN B-6) 100 MG tablet, Take 200 mg by mouth daily. , Disp: , Rfl:  .  simvastatin (ZOCOR) 20 MG tablet, Take 1 tablet (20 mg total) by mouth every evening., Disp: 90 tablet, Rfl: 3 .  torsemide (DEMADEX) 10 MG tablet, Take  10 mg by mouth daily. , Disp: , Rfl: 6  Current Facility-Administered Medications:  .  gemcitabine (GEMZAR) 2,000 mg in sodium chloride irrigation 0.9 % chemo infusion, 2,000 mg, Irrigation, Once, Hollice Espy, MD  Review of Systems  Constitutional: Negative.   Respiratory: Negative.   Cardiovascular: Negative.   Endocrine: Negative.   Neurological: Negative.     Social History   Tobacco Use  . Smoking status: Former Smoker    Packs/day: 3.00    Years: 62.00    Pack years: 186.00    Types: Cigarettes    Last attempt to quit: 08/08/2010    Years since quitting: 8.6  . Smokeless tobacco: Never Used  Substance Use Topics  . Alcohol use: No    Comment: former heavy alcohol use      Objective:   BP (!) 150/72 (BP Location: Left Arm, Patient Position: Sitting, Cuff Size:  Large)   Pulse 76   Temp 98.2 F (36.8 C) (Oral)   Resp 16   Wt 199 lb (90.3 kg)   BMI 30.26 kg/m  Vitals:   04/05/19 0818  BP: (!) 150/72  Pulse: 76  Resp: 16  Temp: 98.2 F (36.8 C)  TempSrc: Oral  Weight: 199 lb (90.3 kg)     Physical Exam Vitals signs reviewed.  Constitutional:      General: He is not in acute distress.    Appearance: Normal appearance. He is well-developed. He is obese. He is not ill-appearing or diaphoretic.  HENT:     Head: Normocephalic and atraumatic.  Neck:     Musculoskeletal: Normal range of motion and neck supple.     Thyroid: No thyromegaly.     Vascular: No JVD.     Trachea: No tracheal deviation.  Cardiovascular:     Rate and Rhythm: Normal rate and regular rhythm.     Heart sounds: Normal heart sounds. No murmur. No friction rub. No gallop.   Pulmonary:     Effort: Pulmonary effort is normal. No respiratory distress.     Breath sounds: Wheezing and rhonchi present. No rales.  Lymphadenopathy:     Cervical: No cervical adenopathy.  Neurological:     Mental Status: He is alert.         Assessment & Plan    1. Type 2 diabetes mellitus with diabetic nephropathy, with long-term current use of insulin (HCC) A1c has previously been stable. Will check labs as below and f/u pending results. I will see him back in 3 months for CPE/AWV. - HgB A1c  2. Chronic obstructive pulmonary disease, unspecified COPD type ([place]) Stable. Continue all prescribed medications.  - montelukast (SINGULAIR) 10 MG tablet; Take 1 tablet (10 mg total) by mouth at bedtime.  Dispense: 90 tablet; Refill: 1  3. Essential hypertension Stable. Diagnosis pulled for medication refill. Continue current medical treatment plan. Continue all other medications. - lisinopril (ZESTRIL) 10 MG tablet; Take 1 tablet (10 mg total) by mouth daily.  Dispense: 90 tablet; Refill: 3  4. Heart & renal disease, hypertensive, with heart failure (HCC) Stable. Diagnosis pulled for  medication refill. Continue current medical treatment plan. Has appt with Dr. Candiss Norse next week.  - torsemide (DEMADEX) 10 MG tablet; Take 1 tablet (10 mg total) by mouth daily.  Dispense: 90 tablet; Refill: [place], PA-C  [facility]

## 2019-04-06 ENCOUNTER — Telehealth: Payer: Self-pay

## 2019-04-06 LAB — HEMOGLOBIN A1C
Est. average glucose Bld gHb Est-mCnc: 143 mg/dL
Hgb A1c MFr Bld: 6.6 % — ABNORMAL HIGH (ref 4.8–5.6)

## 2019-04-06 NOTE — Telephone Encounter (Signed)
-----   Message from Mar Daring, PA-C sent at 04/06/2019 12:18 PM EDT ----- A1c stable at 6.6. had been 6.7.

## 2019-04-06 NOTE — Telephone Encounter (Signed)
Viewed by Orinda Kenner on 04/06/2019 12:44 PM

## 2019-04-12 ENCOUNTER — Encounter: Payer: Self-pay | Admitting: Urology

## 2019-04-12 ENCOUNTER — Other Ambulatory Visit: Payer: Self-pay

## 2019-04-12 ENCOUNTER — Ambulatory Visit: Payer: PPO | Admitting: Urology

## 2019-04-12 VITALS — BP 121/54 | HR 84 | Ht 68.0 in | Wt 199.0 lb

## 2019-04-12 DIAGNOSIS — Z8551 Personal history of malignant neoplasm of bladder: Secondary | ICD-10-CM

## 2019-04-12 DIAGNOSIS — C61 Malignant neoplasm of prostate: Secondary | ICD-10-CM

## 2019-04-12 LAB — MICROSCOPIC EXAMINATION
Bacteria, UA: NONE SEEN
RBC, Urine: >30 /hpf — AB (ref 0–2)
WBC, UA: NONE SEEN /hpf (ref 0–5)

## 2019-04-12 LAB — URINALYSIS, COMPLETE
Bilirubin, UA: NEGATIVE
Glucose, UA: NEGATIVE
Ketones, UA: NEGATIVE
Leukocytes,UA: NEGATIVE
Nitrite, UA: NEGATIVE
Protein,UA: NEGATIVE
Specific Gravity, UA: 1.015 (ref 1.005–1.030)
Urobilinogen, Ur: 0.2 mg/dL (ref 0.2–1.0)
pH, UA: 5 (ref 5.0–7.5)

## 2019-04-12 NOTE — Progress Notes (Signed)
   04/12/19  CC:  Chief Complaint  Patient presents with  . Cysto    HPI: 84 year old male with personal history of nonmetastatic castrate resistant prostate cancer and history of bladder cancer who returns today for routine office cystoscopy.  In terms of prostate cancer, please see previous notes.  He has an onset of castrate resistant prostate cancer status post prostatectomy/orchiectomy with a rising PSA.  More recently, he was started on apalutamide with his PSA undetectable levels on 02/06/2019.  He is tolerating the medication quite well.  He also has a personal history of bladder cancer. This was diagnosed in 2007 with unknown pathology although Dr. Bjorn Loser notes indicate CIS of the bladder. It also appears that he had a low-grade noninvasive urothelial carcinoma recurrence in 2018. He did a biopsy performed in the office 04/2018 which was benign.  More recently, he returned to the operating room on 12/26/2018 where the left UO was noted to be capacious.  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  Blood pressure (!) 121/54, pulse 84, height 5\' 8"  (1.727 m), weight 199 lb (90.3 kg). NED. A&Ox3.   No respiratory distress   Abd soft, NT, ND Normal phallus with bilateral descended testicles  Cystoscopy Procedure Note  Patient identification was confirmed, informed consent was obtained, and patient was prepped using Betadine solution.  Lidocaine jelly was administered per urethral meatus.     Pre-Procedure: - Inspection reveals a normal caliber ureteral meatus.  Procedure: The flexible cystoscope was introduced without difficulty - No urethral strictures/lesions are present. - Surgically absent prostate  - Normal bladder neck with few small subtle areas of nonspecific erythema. - Bilateral ureteral orifices identified, left is stably capacious - Bladder mucosa  reveals no  ulcers, tumors, or lesions - No bladder stones - No trabeculation  Retroflexion shows unremarkable.   Post-Procedure: - Patient tolerated the procedure well  Assessment/ Plan:  1. History of bladder cancer In the setting of recent biopsy with atypia, will continue to follow somewhat closely Plan for cystoscopy again in 3 months possibly with urine cytology He is agreeable this plan - Urinalysis, Complete  2. Prostate cancer [facility]) Most recent PSA 01/2019 undetectable on apalutamide/orchiectomy Tolerating well We will check PSA at next visit Continue this medication   Return in about 3 months (around 07/13/2019) for PSA/ cystoscopy.  Hollice Espy, MD

## 2019-04-12 NOTE — Progress Notes (Signed)
In and Out Catheterization  Patient is present today for a I & O catheterization due to urinary incontinence and unable to provide sample prior to cysto. Patient was cleaned and prepped in a sterile fashion with betadine and Lidocaine 2% jelly was instilled into the urethra.  A 14FR cath was inserted no complications were noted , 33ml of urine return was noted, urine was clear in color. A clean urine sample was collected for UA. Bladder was drained  And catheter was removed with out difficulty.    Preformed by: Fonnie Jarvis, CMA

## 2019-04-18 DIAGNOSIS — E1129 Type 2 diabetes mellitus with other diabetic kidney complication: Secondary | ICD-10-CM | POA: Diagnosis not present

## 2019-04-18 DIAGNOSIS — I129 Hypertensive chronic kidney disease with stage 1 through stage 4 chronic kidney disease, or unspecified chronic kidney disease: Secondary | ICD-10-CM | POA: Diagnosis not present

## 2019-04-18 DIAGNOSIS — I10 Essential (primary) hypertension: Secondary | ICD-10-CM | POA: Diagnosis not present

## 2019-04-18 DIAGNOSIS — N184 Chronic kidney disease, stage 4 (severe): Secondary | ICD-10-CM | POA: Diagnosis not present

## 2019-05-18 IMAGING — US US RENAL
1 series · 14 of 23 positions shown · non-contrast
Comparison: Abdomen and pelvis CT dated 03/30/2008.

CLINICAL DATA: Stage 3 chronic kidney disease.

EXAM:
RENAL / URINARY TRACT ULTRASOUND COMPLETE

[Series 1: us renal · 0.26mm/px · 14 of 23 slices shown]
[im 1/23]
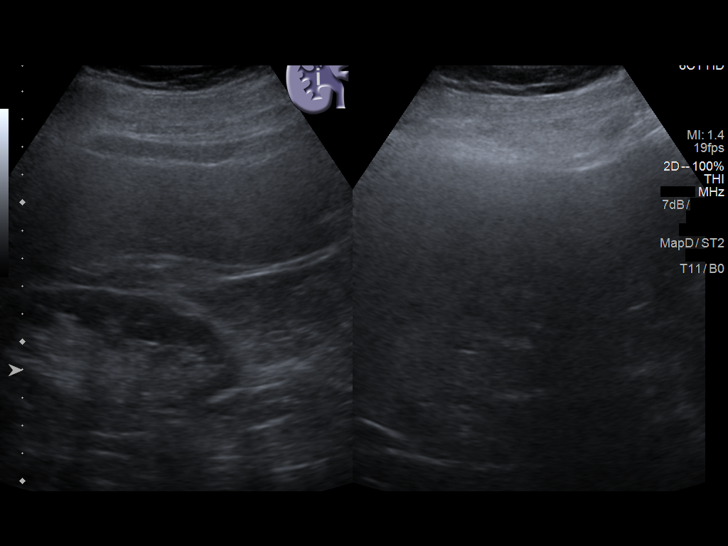
[im 3/23]
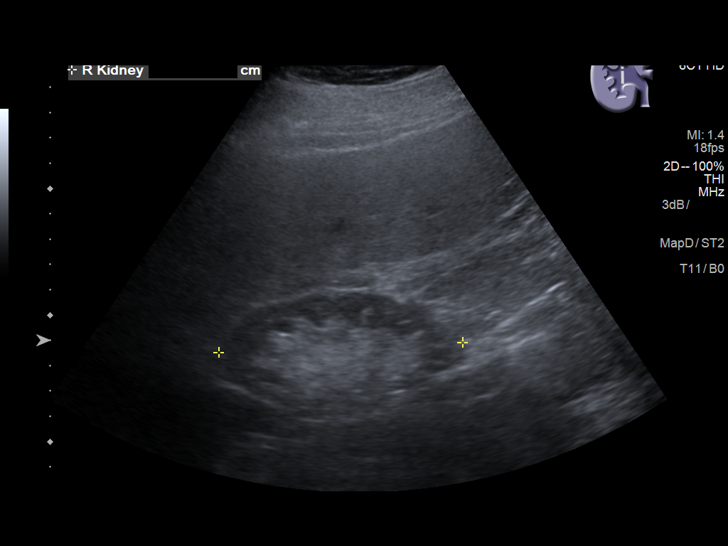
[im 5/23]
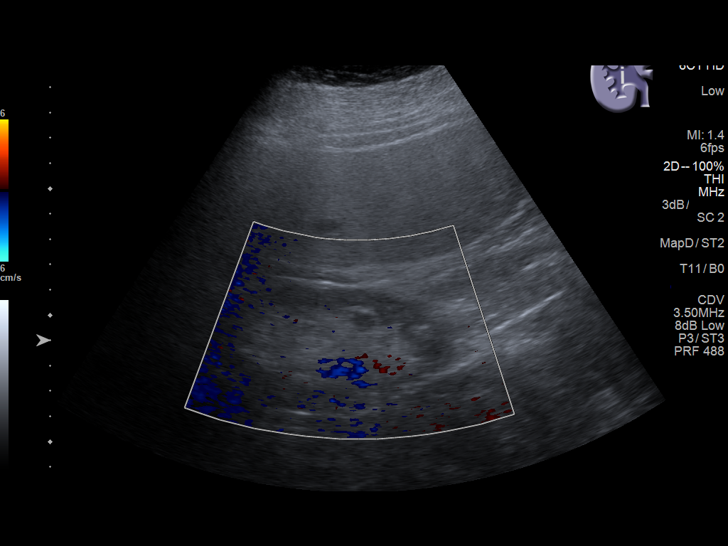
[im 6/23]
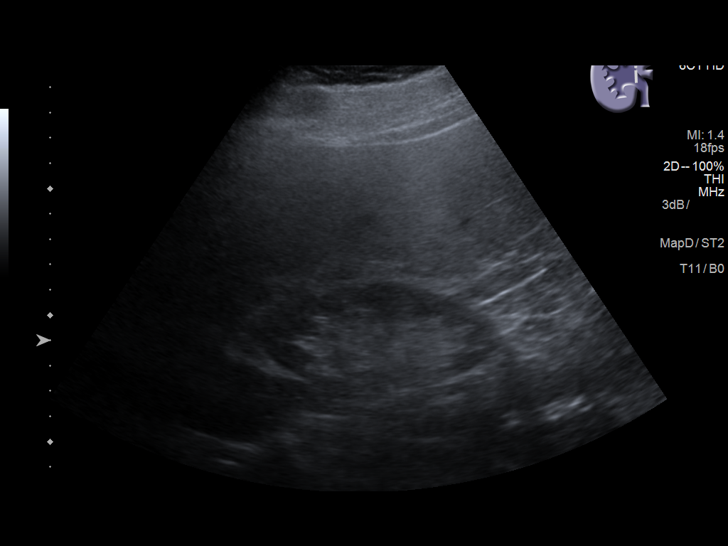
[im 8/23]
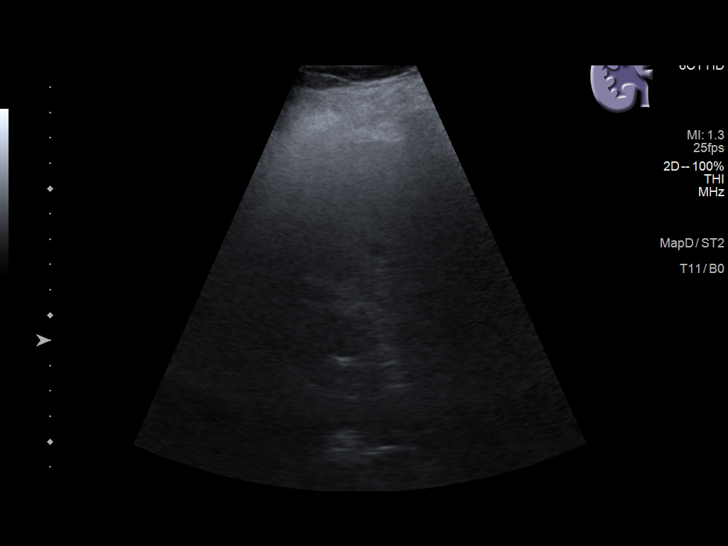
[im 10/23]
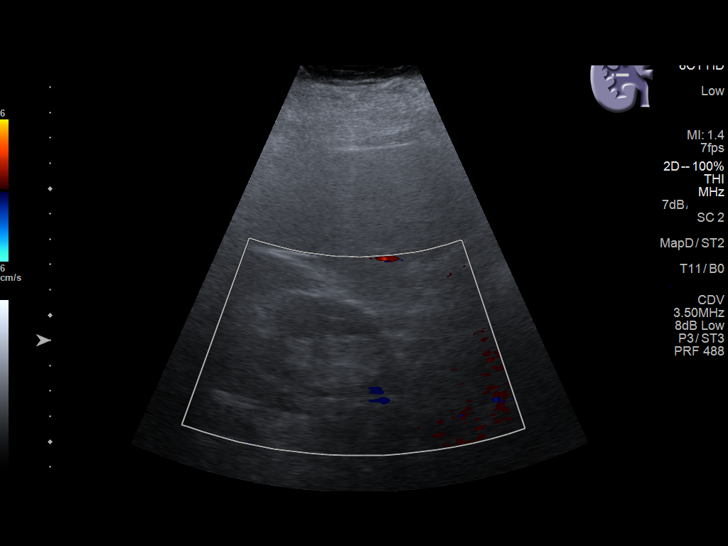
[im 11/23]
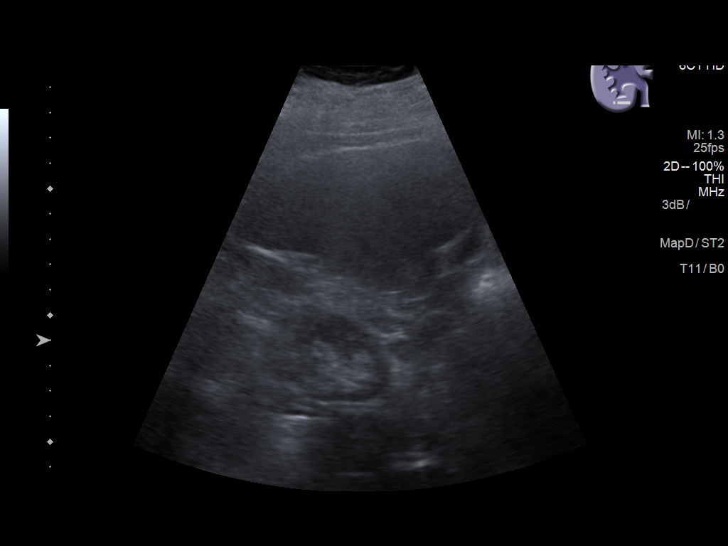
[im 13/23]
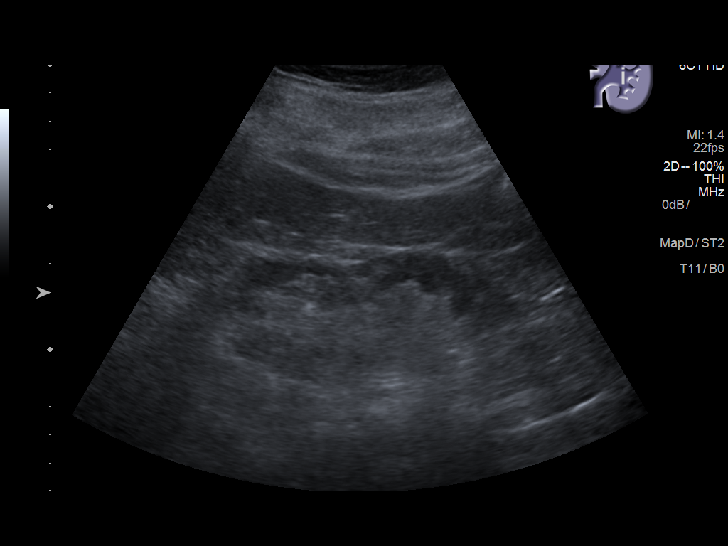
[im 14/23]
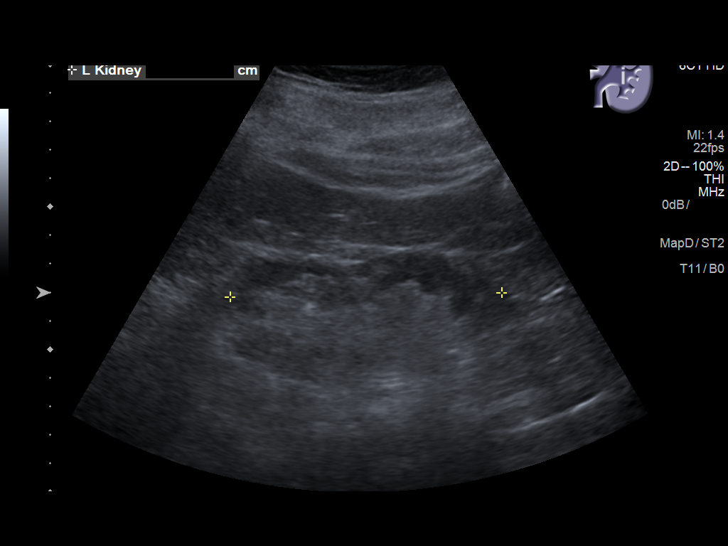
[im 16/23]
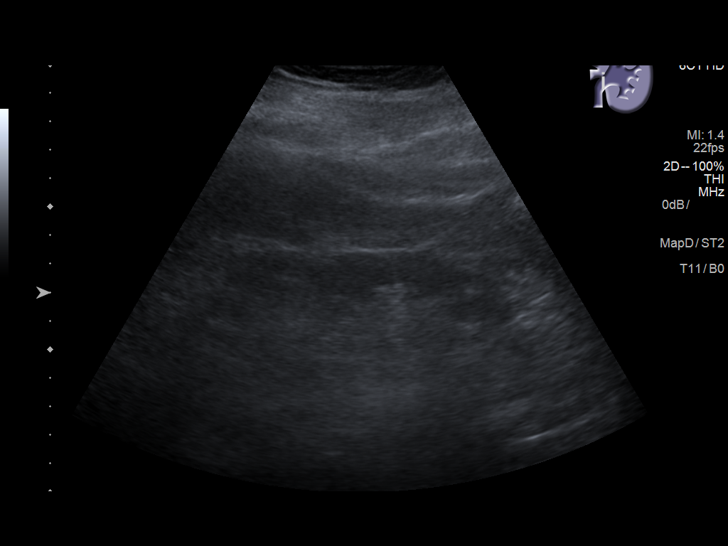
[im 18/23]
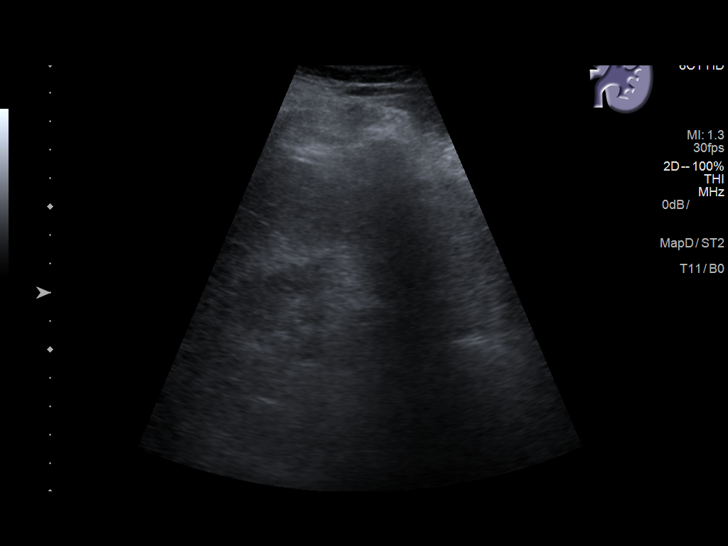
[im 19/23]
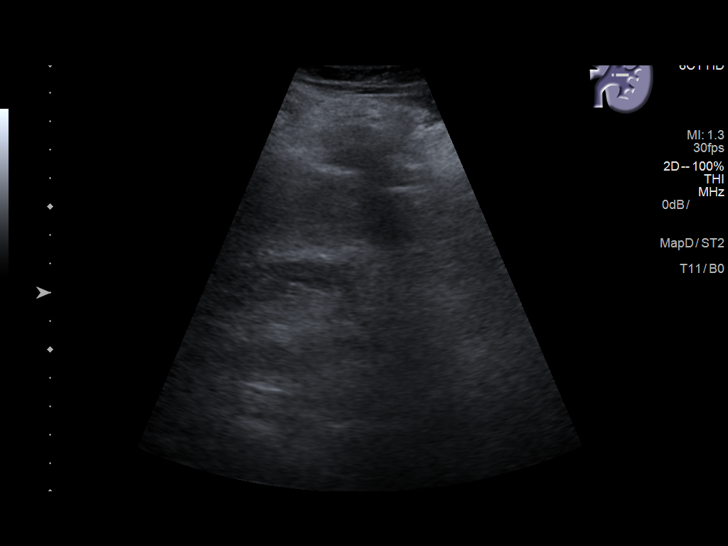
[im 21/23]
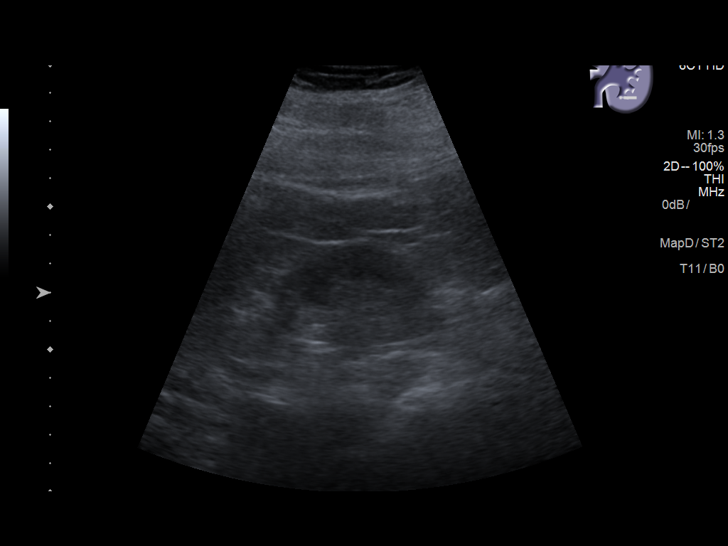
[im 23/23]
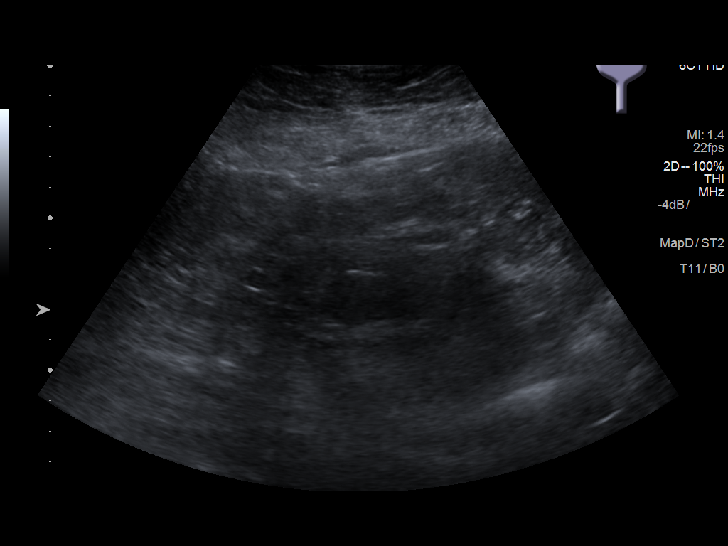

[14 of 23 positions shown; findings below may reference images not displayed]

FINDINGS: Right Kidney:

Length: 9.6 cm. Normal echotexture. Diffuse parenchymal thinning and
prominent renal sinus fat. No hydronephrosis.

Left Kidney:

Length: 9.5 cm. Normal echotexture. Diffuse parenchymal thinning and
prominent renal sinus fat. No hydronephrosis.

Bladder:

Not visualized.
IMPRESSION: Bilateral renal parenchymal atrophy without hydronephrosis. Both
kidneys have normal echotexture

## 2019-05-24 ENCOUNTER — Other Ambulatory Visit: Payer: Self-pay | Admitting: Family Medicine

## 2019-05-24 DIAGNOSIS — J449 Chronic obstructive pulmonary disease, unspecified: Secondary | ICD-10-CM

## 2019-06-19 ENCOUNTER — Other Ambulatory Visit: Payer: Self-pay | Admitting: Physician Assistant

## 2019-06-19 DIAGNOSIS — G629 Polyneuropathy, unspecified: Secondary | ICD-10-CM

## 2019-07-07 NOTE — Progress Notes (Signed)
Patient: Erik Gibbs Male    DOB: 1935/11/02   84 y.o.   MRN: [id number] Visit Date: 07/10/2019  Today's Provider: Mar Daring, PA-C   Chief Complaint  Patient presents with  . Follow-up   Subjective:     HPI    Diabetes Mellitus Type II, Follow-up:   Lab Results  Component Value Date   HGBA1C 7.4 (A) 07/10/2019   HGBA1C 6.6 (H) 04/05/2019   HGBA1C 6.7 (A) 12/30/2018   Last seen for diabetes 3 months ago.  Management since then includes; labs checked, no changes. He reports excellent compliance with treatment. He is not having side effects.  Current symptoms include none and have been unchanged. Sugar levels at home:100's-200's Had some 70's-80's  But reports that he was able to feel it so he ate a peanut butter cup and a plain donut. ------------------------------------------------------------------------   Hypertension, follow-up:  BP Readings from Last 3 Encounters:  07/10/19 (!) 145/71  04/12/19 (!) 121/54  04/05/19 (!) 150/72    He was last seen for hypertension 3 months ago.  BP at that visit was 150/72. Management since that visit includes; labs checked, no changes.He reports excellent compliance with treatment. He is not having side effects.  He is experiencing none.  Patient denies chest pain, chest pressure/discomfort, fatigue, irregular heart beat, lower extremity edema and near-syncope.   Cardiovascular risk factors include advanced age (older than 29 for men, 81 for women), diabetes mellitus, hypertension, male gender and obesity (BMI >= 30 kg/m2).   ------------------------------------------------------------------------  Chronic obstructive pulmonary disease, unspecified COPD type ([place]) From 04/05/2019-Stable. Continue all prescribed medications.   Heart & renal disease, hypertensive, with heart failure ([place]) From 04/05/2019-Stable. Continue current medical treatment plan.    Allergies  Allergen Reactions  . Amlodipine    BP drops severly  . Nitrofuran Derivatives     Unknown reaction  . Povidone-Iodine Rash     Current Outpatient Medications:  .  apalutamide (ERLEADA) 60 MG tablet, Take 4 tablets (240 mg total) by mouth daily. May be taken with or without food. Swallow tablets whole., Disp: 4 tablet, Rfl: 11 .  aspirin 81 MG tablet, Take 81 mg by mouth daily.  , Disp: , Rfl:  .  Cholecalciferol 25 MCG (1000 UT) tablet, Take 1,000 Units by mouth daily. , Disp: , Rfl:  .  cilostazol (PLETAL) 100 MG tablet, Take 1 tablet (100 mg total) by mouth 2 (two) times daily., Disp: 180 tablet, Rfl: 3 .  Cyanocobalamin (B-12 PO), Take 1,000 mcg by mouth daily. , Disp: , Rfl:  .  docusate sodium (COLACE) 50 MG capsule, Take 50 mg by mouth 2 (two) times daily. , Disp: , Rfl:  .  famotidine (PEPCID) 20 MG tablet, Take 1 tablet (20 mg total) by mouth 2 (two) times daily., Disp: 180 tablet, Rfl: 3 .  FREESTYLE LITE test strip, USE 1 STRIP TO CHECK GLUCOSE TWICE DAILY AS NEEDED, Disp: 200 each, Rfl: 3 .  gabapentin (NEURONTIN) 300 MG capsule, Take 1 capsule by mouth at bedtime, Disp: 90 capsule, Rfl: 3 .  insulin NPH-insulin regular (NOVOLIN 70/30) (70-30) 100 UNIT/ML injection, Inject 35 Units into the skin 2 (two) times daily with a meal. , Disp: , Rfl:  .  ipratropium-albuterol (DUONEB) 0.5-2.5 (3) MG/3ML SOLN, USE 1 AMPULE IN NEBULIZER EVERY 4 HOURS AS NEEDED, Disp: 1080 mL, Rfl: 5 .  iron polysaccharides (NIFEREX) 150 MG capsule, Take 150 mg by mouth 3 (three) times  daily., Disp: , Rfl:  .  Lancets (FREESTYLE) lancets, USE 1  TO CHECK GLUCOSE TWICE DAILY, Disp: 200 each, Rfl: 5 .  lisinopril (ZESTRIL) 10 MG tablet, Take 1 tablet (10 mg total) by mouth daily., Disp: 90 tablet, Rfl: 3 .  loratadine (CLARITIN) 10 MG tablet, Take 10 mg by mouth daily.  , Disp: , Rfl:  .  montelukast (SINGULAIR) 10 MG tablet, Take 1 tablet (10 mg total) by mouth at bedtime., Disp: 90 tablet, Rfl: 1 .  niacin 500 MG CR capsule, Take 1,000 mg by  mouth every evening. , Disp: , Rfl:  .  Omega-3 Fatty Acids (FISH OIL) 1200 MG CAPS, Take 1,200 mg by mouth daily. , Disp: , Rfl:  .  pyridOXINE (VITAMIN B-6) 100 MG tablet, Take 200 mg by mouth daily. , Disp: , Rfl:  .  simvastatin (ZOCOR) 20 MG tablet, Take 1 tablet (20 mg total) by mouth every evening., Disp: 90 tablet, Rfl: 3 .  SYMBICORT 160-4.5 MCG/ACT inhaler, Inhale 2 puffs by mouth twice daily, Disp: 11 g, Rfl: 5 .  torsemide (DEMADEX) 10 MG tablet, Take 1 tablet (10 mg total) by mouth daily., Disp: 90 tablet, Rfl: 1  Current Facility-Administered Medications:  .  gemcitabine (GEMZAR) 2,000 mg in sodium chloride irrigation 0.9 % chemo infusion, 2,000 mg, Irrigation, Once, Hollice Espy, MD  Review of Systems  Constitutional: Negative for appetite change, chills and fever.  Respiratory: Negative for chest tightness, shortness of breath and wheezing.   Cardiovascular: Negative for chest pain and palpitations.  Gastrointestinal: Negative for abdominal pain, nausea and vomiting.  Endocrine: Negative for polydipsia, polyphagia and polyuria.  Neurological: Negative for dizziness, weakness, numbness and headaches.    Social History   Tobacco Use  . Smoking status: Former Smoker    Packs/day: 3.00    Years: 62.00    Pack years: 186.00    Types: Cigarettes    Quit date: 08/08/2010    Years since quitting: 8.9  . Smokeless tobacco: Never Used  Substance Use Topics  . Alcohol use: No    Comment: former heavy alcohol use      Objective:   BP (!) 145/71 (BP Location: Left Arm, Patient Position: Sitting, Cuff Size: Large)   Pulse 60   Temp (!) 96.9 F (36.1 C) (Other (Comment)) Comment (Src): forehead  Resp 16   Wt 200 lb 12.8 oz (91.1 kg)   BMI 30.53 kg/m  Vitals:   07/10/19 0819  BP: (!) 145/71  Pulse: 60  Resp: 16  Temp: (!) 96.9 F (36.1 C)  TempSrc: Other (Comment)  Weight: 200 lb 12.8 oz (91.1 kg)     Physical Exam Vitals signs reviewed.  Constitutional:       General: He is not in acute distress.    Appearance: Normal appearance. He is well-developed. He is not ill-appearing or diaphoretic.  HENT:     Head: Normocephalic and atraumatic.  Neck:     Musculoskeletal: Normal range of motion and neck supple.     Thyroid: No thyromegaly.     Vascular: No JVD.     Trachea: No tracheal deviation.  Cardiovascular:     Rate and Rhythm: Normal rate and regular rhythm.     Pulses: Normal pulses.     Heart sounds: Normal heart sounds. No murmur. No friction rub. No gallop.   Pulmonary:     Effort: Pulmonary effort is normal. No respiratory distress.     Breath sounds: Decreased air movement present.  Examination of the right-middle field reveals rhonchi. Examination of the left-middle field reveals rhonchi. Examination of the right-lower field reveals rhonchi. Examination of the left-lower field reveals rhonchi. Decreased breath sounds and rhonchi present. No wheezing or rales.  Lymphadenopathy:     Cervical: No cervical adenopathy.  Neurological:     Mental Status: He is alert.      Results for orders placed or performed in visit on 07/10/19  POCT glycosylated hemoglobin (Hb A1C)  Result Value Ref Range   Hemoglobin A1C 7.4 (A) 4.0 - 5.6 %   Est. average glucose Bld gHb Est-mCnc 166        Assessment & Plan    1. Type 2 diabetes mellitus with diabetic nephropathy, with long-term current use of insulin (HCC) A1c increased to 7.4 from 6.6. Admits to eating resee's peanut butter cups. Will limit or stop. Continue Novolin 70/30 35 units BID. I will see him back in 3 months.   2. Benign hypertension with CKD (chronic kidney disease) stage III (HCC) Stable. Continue Lisinopril 10mg .      Mar Daring, PA-C  [facility]

## 2019-07-10 ENCOUNTER — Ambulatory Visit (INDEPENDENT_AMBULATORY_CARE_PROVIDER_SITE_OTHER): Payer: PPO | Admitting: Physician Assistant

## 2019-07-10 ENCOUNTER — Other Ambulatory Visit: Payer: Self-pay | Admitting: Family Medicine

## 2019-07-10 ENCOUNTER — Encounter: Payer: Self-pay | Admitting: Physician Assistant

## 2019-07-10 VITALS — BP 145/71 | HR 60 | Temp 96.9°F | Resp 16 | Wt 200.8 lb

## 2019-07-10 DIAGNOSIS — N183 Chronic kidney disease, stage 3 (moderate): Secondary | ICD-10-CM

## 2019-07-10 DIAGNOSIS — Z794 Long term (current) use of insulin: Secondary | ICD-10-CM | POA: Diagnosis not present

## 2019-07-10 DIAGNOSIS — E1121 Type 2 diabetes mellitus with diabetic nephropathy: Secondary | ICD-10-CM

## 2019-07-10 DIAGNOSIS — I129 Hypertensive chronic kidney disease with stage 1 through stage 4 chronic kidney disease, or unspecified chronic kidney disease: Secondary | ICD-10-CM

## 2019-07-10 DIAGNOSIS — C61 Malignant neoplasm of prostate: Secondary | ICD-10-CM

## 2019-07-10 LAB — POCT GLYCOSYLATED HEMOGLOBIN (HGB A1C)
Est. average glucose Bld gHb Est-mCnc: 166
Hemoglobin A1C: 7.4 % — AB (ref 4.0–5.6)

## 2019-07-11 ENCOUNTER — Other Ambulatory Visit: Payer: Self-pay

## 2019-07-11 ENCOUNTER — Other Ambulatory Visit: Payer: PPO

## 2019-07-11 DIAGNOSIS — C61 Malignant neoplasm of prostate: Secondary | ICD-10-CM

## 2019-07-12 LAB — PSA: Prostate Specific Ag, Serum: <0.1 ng/mL (ref 0.0–4.0)

## 2019-07-13 ENCOUNTER — Encounter: Payer: Self-pay | Admitting: Urology

## 2019-07-13 ENCOUNTER — Other Ambulatory Visit: Payer: Self-pay

## 2019-07-13 ENCOUNTER — Ambulatory Visit (INDEPENDENT_AMBULATORY_CARE_PROVIDER_SITE_OTHER): Payer: PPO | Admitting: Urology

## 2019-07-13 VITALS — BP 170/53 | HR 66 | Ht 68.0 in | Wt 203.0 lb

## 2019-07-13 DIAGNOSIS — Z8551 Personal history of malignant neoplasm of bladder: Secondary | ICD-10-CM

## 2019-07-13 DIAGNOSIS — C61 Malignant neoplasm of prostate: Secondary | ICD-10-CM

## 2019-07-13 LAB — MICROSCOPIC EXAMINATION
Bacteria, UA: NONE SEEN
WBC, UA: NONE SEEN /hpf (ref 0–5)

## 2019-07-13 LAB — URINALYSIS, COMPLETE
Bilirubin, UA: NEGATIVE
Glucose, UA: NEGATIVE
Ketones, UA: NEGATIVE
Leukocytes,UA: NEGATIVE
Nitrite, UA: NEGATIVE
Protein,UA: NEGATIVE
Specific Gravity, UA: 1.02 (ref 1.005–1.030)
Urobilinogen, Ur: 0.2 mg/dL (ref 0.2–1.0)
pH, UA: 5.5 (ref 5.0–7.5)

## 2019-07-13 NOTE — Progress Notes (Signed)
   04/12/19  CC:  Chief Complaint  Patient presents with  . Cysto    HPI: 84 year old male with personal history of nonmetastatic castrate resistant prostate cancer and history of bladder cancer who returns today for routine office cystoscopy.  In terms of prostate cancer, please see previous notes.  He has an onset of castrate resistant prostate cancer status post prostatectomy/orchiectomy with a rising PSA.  More recently, he was started on apalutamide with his PSA undetectable levels on 07/11/19 .  He is tolerating the medication quite well.  He also has a personal history of bladder cancer. This was diagnosed in 2007 with unknown pathology although Dr. Bjorn Loser notes indicate CIS of the bladder. It also appears that he had a low-grade noninvasive urothelial carcinoma recurrence in 2018. He did a biopsy performed in the office 04/2018 which was benign.  More recently, he returned to the operating room on 12/26/2018 where the left UO was noted to be capacious.  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  UA today with 3-10 RBC/ HPF otherwise negative.  Blood pressure (!) 170/53, pulse 66, height 5\' 8"  (1.727 m), weight 203 lb (92.1 kg NED. A&Ox3.   No respiratory distress   Abd soft, NT, ND Normal phallus with bilateral descended testicles  Cystoscopy Procedure Note  Patient identification was confirmed, informed consent was obtained, and patient was prepped using Betadine solution.  Lidocaine jelly was administered per urethral meatus.     Pre-Procedure: - Inspection reveals a normal caliber ureteral meatus.  Procedure: The flexible cystoscope was introduced without difficulty - No urethral strictures/lesions are present. - Surgically absent prostate  - Normal bladder neck with few small subtle areas of nonspecific erythema. - Bilateral ureteral orifices identified, left is  stably capacious - Bladder mucosa  reveals no ulcers, tumors, or lesions - No bladder stones - No trabeculation  Retroflexion shows unremarkable.   Post-Procedure: - Patient tolerated the procedure well  Assessment/ Plan:  1. History of bladder cancer Given previous atypia, will continue to follow on a every 6 month basis He is agreeable this plan - Urinalysis, Complete  2. Prostate cancer ([place]) Most recent PSA 01/2019 undetectable on apalutamide/orchiectomy Tolerating well PSA remains undetectable Continue this medication P PSA in 6 months  Return in about 6 months (around 01/13/2020) for MD follow up PSA and CYSTO.  Hollice Espy, MD

## 2019-07-21 ENCOUNTER — Other Ambulatory Visit: Payer: Self-pay | Admitting: *Deleted

## 2019-07-21 MED ORDER — APALUTAMIDE 60 MG PO TABS: 240.0000 mg | ORAL_TABLET | Freq: Every day | ORAL | 11 refills | Status: DC

## 2019-07-24 ENCOUNTER — Other Ambulatory Visit: Payer: Self-pay | Admitting: Urology

## 2019-07-24 ENCOUNTER — Telehealth: Payer: Self-pay | Admitting: Urology

## 2019-07-24 NOTE — Telephone Encounter (Signed)
Verified quantity spoke with Pharmacist

## 2019-07-24 NOTE — Telephone Encounter (Signed)
[facility] called to clarify quantity for Rx sent in for pt. She is also going to fax it to [place].

## 2019-07-26 ENCOUNTER — Other Ambulatory Visit: Payer: Self-pay | Admitting: Physician Assistant

## 2019-07-26 DIAGNOSIS — E1121 Type 2 diabetes mellitus with diabetic nephropathy: Secondary | ICD-10-CM

## 2019-08-01 ENCOUNTER — Other Ambulatory Visit: Payer: Self-pay

## 2019-08-01 ENCOUNTER — Ambulatory Visit (INDEPENDENT_AMBULATORY_CARE_PROVIDER_SITE_OTHER): Payer: PPO

## 2019-08-01 DIAGNOSIS — Z23 Encounter for immunization: Secondary | ICD-10-CM

## 2019-08-14 DIAGNOSIS — N184 Chronic kidney disease, stage 4 (severe): Secondary | ICD-10-CM | POA: Diagnosis not present

## 2019-08-14 DIAGNOSIS — E1121 Type 2 diabetes mellitus with diabetic nephropathy: Secondary | ICD-10-CM | POA: Diagnosis not present

## 2019-08-14 DIAGNOSIS — I10 Essential (primary) hypertension: Secondary | ICD-10-CM | POA: Diagnosis not present

## 2019-08-14 DIAGNOSIS — D631 Anemia in chronic kidney disease: Secondary | ICD-10-CM | POA: Diagnosis not present

## 2019-10-11 ENCOUNTER — Encounter: Payer: Self-pay | Admitting: Physician Assistant

## 2019-10-11 ENCOUNTER — Other Ambulatory Visit: Payer: Self-pay

## 2019-10-11 ENCOUNTER — Ambulatory Visit (INDEPENDENT_AMBULATORY_CARE_PROVIDER_SITE_OTHER): Payer: PPO | Admitting: Physician Assistant

## 2019-10-11 VITALS — BP 150/74 | HR 61 | Temp 96.9°F | Resp 16 | Wt 201.4 lb

## 2019-10-11 DIAGNOSIS — G629 Polyneuropathy, unspecified: Secondary | ICD-10-CM | POA: Diagnosis not present

## 2019-10-11 DIAGNOSIS — E7800 Pure hypercholesterolemia, unspecified: Secondary | ICD-10-CM | POA: Diagnosis not present

## 2019-10-11 DIAGNOSIS — I129 Hypertensive chronic kidney disease with stage 1 through stage 4 chronic kidney disease, or unspecified chronic kidney disease: Secondary | ICD-10-CM

## 2019-10-11 DIAGNOSIS — E1121 Type 2 diabetes mellitus with diabetic nephropathy: Secondary | ICD-10-CM | POA: Diagnosis not present

## 2019-10-11 DIAGNOSIS — D692 Other nonthrombocytopenic purpura: Secondary | ICD-10-CM

## 2019-10-11 DIAGNOSIS — N184 Chronic kidney disease, stage 4 (severe): Secondary | ICD-10-CM

## 2019-10-11 DIAGNOSIS — Z794 Long term (current) use of insulin: Secondary | ICD-10-CM | POA: Diagnosis not present

## 2019-10-11 DIAGNOSIS — I130 Hypertensive heart and chronic kidney disease with heart failure and stage 1 through stage 4 chronic kidney disease, or unspecified chronic kidney disease: Secondary | ICD-10-CM | POA: Diagnosis not present

## 2019-10-11 DIAGNOSIS — I739 Peripheral vascular disease, unspecified: Secondary | ICD-10-CM | POA: Diagnosis not present

## 2019-10-11 DIAGNOSIS — J449 Chronic obstructive pulmonary disease, unspecified: Secondary | ICD-10-CM | POA: Diagnosis not present

## 2019-10-11 LAB — POCT GLYCOSYLATED HEMOGLOBIN (HGB A1C)
Est. average glucose Bld gHb Est-mCnc: 154
Hemoglobin A1C: 7 % — AB (ref 4.0–5.6)

## 2019-10-11 MED ORDER — TORSEMIDE 10 MG PO TABS: 10.0000 mg | ORAL_TABLET | Freq: Every day | ORAL | 3 refills | Status: DC

## 2019-10-11 MED ORDER — GABAPENTIN 300 MG PO CAPS: 300.0000 mg | ORAL_CAPSULE | Freq: Every day | ORAL | 3 refills | Status: DC

## 2019-10-11 MED ORDER — MONTELUKAST SODIUM 10 MG PO TABS: 10.0000 mg | ORAL_TABLET | Freq: Every day | ORAL | 3 refills | Status: DC

## 2019-10-11 MED ORDER — SIMVASTATIN 20 MG PO TABS: 20.0000 mg | ORAL_TABLET | Freq: Every evening | ORAL | 3 refills | Status: DC

## 2019-10-11 NOTE — Progress Notes (Signed)
Patient: Erik Gibbs Male    DOB: 1935/05/11   84 y.o.   MRN: [id number] Visit Date: 10/11/2019  Today's Provider: Mar Daring, PA-C   Chief Complaint  Patient presents with  . Follow-up    T2DM   Subjective:    I,Joseline E. Rosas,RMA am acting as a Education administrator for [employer], PA-C.  HPI Type 2 diabetes mellitus with diabetic nephropathy:A1c increased to 7.4 from 6.6. Admits to eating resee's peanut butter cups. Will limit or stop. Continue Novolin 70/30 35 units BID. Blood sugar at home are 90's-100's with some episodes of 80's-70's. He cut his insulin to 26 units in the AM and evening.  Benign hypertension with CKD (chronic kidney disease) stage III Stable. Continue Lisinopril 10mg . Reports that his blood pressures at home have been int the 130's/80's.  Allergies  Allergen Reactions  . Amlodipine     BP drops severly  . Nitrofuran Derivatives     Unknown reaction  . Povidone-Iodine Rash     Current Outpatient Medications:  .  apalutamide (ERLEADA) 60 MG tablet, Take 4 tablets (240 mg total) by mouth daily. May be taken with or without food. Swallow tablets whole., Disp: 4 tablet, Rfl: 11 .  aspirin 81 MG tablet, Take 81 mg by mouth daily.  , Disp: , Rfl:  .  Cholecalciferol 25 MCG (1000 UT) tablet, Take 1,000 Units by mouth daily. , Disp: , Rfl:  .  cilostazol (PLETAL) 100 MG tablet, Take 1 tablet (100 mg total) by mouth 2 (two) times daily., Disp: 180 tablet, Rfl: 3 .  Cyanocobalamin (B-12 PO), Take 1,000 mcg by mouth daily. , Disp: , Rfl:  .  docusate sodium (COLACE) 50 MG capsule, Take 50 mg by mouth 2 (two) times daily. , Disp: , Rfl:  .  famotidine (PEPCID) 20 MG tablet, Take 1 tablet (20 mg total) by mouth 2 (two) times daily., Disp: 180 tablet, Rfl: 3 .  FREESTYLE LITE test strip, USE 1 STRIP TO CHECK GLUCOSE TWICE DAILY AS NEEDED, Disp: 200 each, Rfl: 3 .  gabapentin (NEURONTIN) 300 MG capsule, Take 1 capsule by mouth at bedtime, Disp: 90  capsule, Rfl: 3 .  insulin NPH-insulin regular (NOVOLIN 70/30) (70-30) 100 UNIT/ML injection, Inject 35 Units into the skin 2 (two) times daily with a meal. , Disp: , Rfl:  .  ipratropium-albuterol (DUONEB) 0.5-2.5 (3) MG/3ML SOLN, USE 1 AMPULE IN NEBULIZER EVERY 4 HOURS AS NEEDED, Disp: 1080 mL, Rfl: 5 .  iron polysaccharides (NIFEREX) 150 MG capsule, Take 150 mg by mouth 3 (three) times daily., Disp: , Rfl:  .  Lancets (FREESTYLE) lancets, USE 1 LANCET TO CHECK GLUCOSE TWICE DAILY, Disp: 200 each, Rfl: 3 .  lisinopril (ZESTRIL) 10 MG tablet, Take 1 tablet (10 mg total) by mouth daily., Disp: 90 tablet, Rfl: 3 .  loratadine (CLARITIN) 10 MG tablet, Take 10 mg by mouth daily.  , Disp: , Rfl:  .  montelukast (SINGULAIR) 10 MG tablet, Take 1 tablet (10 mg total) by mouth at bedtime., Disp: 90 tablet, Rfl: 1 .  niacin 500 MG CR capsule, Take 1,000 mg by mouth every evening. , Disp: , Rfl:  .  Omega-3 Fatty Acids (FISH OIL) 1200 MG CAPS, Take 1,200 mg by mouth daily. , Disp: , Rfl:  .  pyridOXINE (VITAMIN B-6) 100 MG tablet, Take 200 mg by mouth daily. , Disp: , Rfl:  .  simvastatin (ZOCOR) 20 MG tablet, Take 1  tablet (20 mg total) by mouth every evening., Disp: 90 tablet, Rfl: 3 .  SYMBICORT 160-4.5 MCG/ACT inhaler, Inhale 2 puffs by mouth twice daily, Disp: 11 g, Rfl: 5 .  torsemide (DEMADEX) 10 MG tablet, Take 1 tablet (10 mg total) by mouth daily., Disp: 90 tablet, Rfl: 1  Current Facility-Administered Medications:  .  gemcitabine (GEMZAR) 2,000 mg in sodium chloride irrigation 0.9 % chemo infusion, 2,000 mg, Irrigation, Once, Hollice Espy, MD  Review of Systems  Constitutional: Negative.   Eyes: Negative for visual disturbance.  Respiratory: Negative for chest tightness and shortness of breath.   Cardiovascular: Negative for chest pain.  Endocrine: Negative for polydipsia, polyphagia and polyuria.  Neurological: Negative for dizziness, light-headedness and headaches.    Social History    Tobacco Use  . Smoking status: Former Smoker    Packs/day: 3.00    Years: 62.00    Pack years: 186.00    Types: Cigarettes    Quit date: 08/08/2010    Years since quitting: 9.1  . Smokeless tobacco: Never Used  Substance Use Topics  . Alcohol use: No    Comment: former heavy alcohol use      Objective:   BP (!) 150/74 (BP Location: Left Arm, Patient Position: Sitting, Cuff Size: Large)   Pulse 61   Temp (!) 96.9 F (36.1 C) (Temporal)   Resp 16   Wt 201 lb 6.4 oz (91.4 kg)   BMI 30.62 kg/m  Vitals:   10/11/19 0821  BP: (!) 150/74  Pulse: 61  Resp: 16  Temp: (!) 96.9 F (36.1 C)  TempSrc: Temporal  Weight: 201 lb 6.4 oz (91.4 kg)  Body mass index is 30.62 kg/m.   Physical Exam Vitals signs reviewed.  Constitutional:      General: He is not in acute distress.    Appearance: Normal appearance. He is well-developed. He is obese. He is not ill-appearing or diaphoretic.  HENT:     Head: Normocephalic and atraumatic.  Neck:     Musculoskeletal: Normal range of motion and neck supple.     Thyroid: No thyromegaly.     Vascular: No JVD.     Trachea: No tracheal deviation.  Cardiovascular:     Rate and Rhythm: Normal rate and regular rhythm.     Heart sounds: Normal heart sounds. No murmur. No friction rub. No gallop.   Pulmonary:     Effort: Pulmonary effort is normal. No respiratory distress.     Breath sounds: Decreased air movement present. No wheezing or rales.  Lymphadenopathy:     Cervical: No cervical adenopathy.  Neurological:     Mental Status: He is alert.      Results for orders placed or performed in visit on 10/11/19  POCT HgB A1C  Result Value Ref Range   Hemoglobin A1C 7.0 (A) 4.0 - 5.6 %   Est. average glucose Bld gHb Est-mCnc 154        Assessment & Plan    1. Type 2 diabetes mellitus with diabetic nephropathy, with long-term current use of insulin (HCC) A1c improved to 7.0 from 7.4. Continue Novolin 70/30 26 units BID.   2. Benign  hypertension with CKD (chronic kidney disease) stage IV (HCC) Stable. Saw Dr. Candiss Norse, Nephrology, in September 2020 (note in [place]) and was given a 6 month f/u.   3. Heart & renal disease, hypertensive, with heart failure ([place]) See above medical treatment plan. Stable. Diagnosis pulled for medication refill. Continue current medical treatment plan. -  torsemide (DEMADEX) 10 MG tablet; Take 1 tablet (10 mg total) by mouth daily.  Dispense: 90 tablet; Refill: 3  4. Pure hypercholesterolemia Stable. Diagnosis pulled for medication refill. Continue current medical treatment plan. - simvastatin (ZOCOR) 20 MG tablet; Take 1 tablet (20 mg total) by mouth every evening.  Dispense: 90 tablet; Refill: 3  5. Chronic obstructive pulmonary disease, unspecified COPD type ([place]) Stable. Diagnosis pulled for medication refill. Continue current medical treatment plan. - montelukast (SINGULAIR) 10 MG tablet; Take 1 tablet (10 mg total) by mouth at bedtime.  Dispense: 90 tablet; Refill: 3  6. Neuropathy Stable. Diagnosis pulled for medication refill. Continue current medical treatment plan. - gabapentin (NEURONTIN) 300 MG capsule; Take 1 capsule (300 mg total) by mouth at bedtime.  Dispense: 90 capsule; Refill: 3  7. Peripheral vascular disease, unspecified ([place]) Stable. Continue Pletal 100mg  BID and ASA 81mg .   8. Senile purpura (HCC) Stable.      Mar Daring, PA-C  [facility]

## 2019-10-11 NOTE — Patient Instructions (Signed)
Living With Diabetes Diabetes (type 1 diabetes mellitus or type 2 diabetes mellitus) is a condition in which the body does not have enough of a hormone called insulin, or the body does not respond properly to insulin. Normally, insulin allows sugars (glucose) to enter cells in the body. The cells use glucose for energy. With diabetes, extra glucose builds up in the blood instead of going into cells, which results in high blood glucose (hyperglycemia). How to manage lifestyle changes Managing diabetes includes medical treatments as well as lifestyle changes. If diabetes is not managed well, serious physical and emotional complications can occur. Taking good care of yourself means that you are responsible for:  Monitoring glucose regularly.  Eating a healthy diet.  Exercising regularly.  Meeting with health care providers.  Taking medicines as directed. Some people may feel a lot of stress about managing their diabetes. This is known as emotional distress, and it is very common. Living with diabetes can place you at risk for emotional distress, depression, or anxiety. These disorders can be confusing and can make diabetes management more difficult. How to recognize stress Emotional distress Symptoms of emotional distress include:  Anger about having a diagnosis of diabetes.  Fear or frustration about your diagnosis and the changes you need to make to manage the condition.  Being overly worried about the care that you need or the cost of the care that you need.  Feeling like you caused your condition by doing something wrong.  Fear of unpredictable situations, like low or high blood glucose.  Feeling judged by your health care providers.  Feeling very alone with the disease.  Getting too tired or worn out with the demands of daily care. Depression Having diabetes means that you are at a higher risk for depression. Having depression also means that you are at a higher risk for  diabetes. Your health care provider may test (screen) you for symptoms of depression. It is important to recognize depression symptoms and to start treatment for depression soon after it is diagnosed. The following are some symptoms of depression:  Loss of interest in things that you used to enjoy.  Trouble sleeping, or often waking up early and not being able to get back to sleep.  A change in appetite.  Feeling tired most of the day.  Feeling nervous and anxious.  Feeling guilty and worrying that you are a burden to others.  Feeling depressed more often than you do not feel that way.  Thoughts of hurting yourself or feeling that you want to die. If you have any of these symptoms for 2 weeks or longer, reach out to a health care provider. Follow these instructions at home: Managing emotional distress The following are some ways to manage emotional distress:  Talk with your health care provider or certified diabetes educator. Consider working with a counselor or therapist.  Learn as much as you can about diabetes and its treatment. Meet with a certified diabetes educator or take a class to learn how to manage your condition.  Keep a journal of your thoughts and concerns.  Accept that some things are out of your control.  Talk with other people who have diabetes. It can help to talk with others about the emotional distress that you feel.  Find ways to manage stress that work for you. These may include art or music therapy, exercise, meditation, and hobbies.  Seek support from spiritual leaders, family, and friends. General instructions  Follow your diabetes management plan.  Keep all follow-up visits as told by your health care provider. This is important. Where to find support   Ask your health care provider to recommend a therapist who understands both depression and diabetes.  Search for information and support from the American Diabetes Association:  www.diabetes.org  Find a certified diabetes educator and make an appointment through [place] [facility] Diabetes Educators: www.diabeteseducator.org Get help right away if:  You have thoughts about hurting yourself or others. If you ever feel like you may hurt yourself or others, or have thoughts about taking your own life, get help right away. You can go to your nearest emergency department or call:  Your local emergency services (911 in the [place]  A suicide crisis helpline, such as the [place] at (873) 287-6562. This is open 24 hours a day. Summary  Diabetes (type 1 diabetes mellitus or type 2 diabetes mellitus) is a condition in which the body does not have enough of a hormone called insulin, or the body does not respond properly to insulin.  Living with diabetes puts you at risk for medical issues, and it also puts you at risk for emotional issues such as emotional distress, depression, and anxiety.  Recognizing the symptoms of emotional distress and depression may help you avoid problems with your diabetes control. It is important to start treatment for emotional distress and depression soon after they are diagnosed.  Having diabetes means that you are at a higher risk for depression. Ask your health care provider to recommend a therapist who understands both depression and diabetes.  If you experience symptoms of emotional distress or depression, it is important to discuss this with your health care provider, certified diabetes educator, or therapist. This information is not intended to replace advice given to you by your health care provider. Make sure you discuss any questions you have with your health care provider. Document Released: 03/25/2017 Document Revised: 11/21/2018 Document Reviewed: 03/25/2017 Elsevier Patient Education  2020 [employer].

## 2019-12-04 DIAGNOSIS — N184 Chronic kidney disease, stage 4 (severe): Secondary | ICD-10-CM | POA: Diagnosis not present

## 2019-12-04 DIAGNOSIS — I10 Essential (primary) hypertension: Secondary | ICD-10-CM | POA: Diagnosis not present

## 2019-12-04 DIAGNOSIS — E1129 Type 2 diabetes mellitus with other diabetic kidney complication: Secondary | ICD-10-CM | POA: Diagnosis not present

## 2019-12-06 DIAGNOSIS — N184 Chronic kidney disease, stage 4 (severe): Secondary | ICD-10-CM | POA: Diagnosis not present

## 2020-01-12 ENCOUNTER — Other Ambulatory Visit: Payer: PPO

## 2020-01-12 ENCOUNTER — Other Ambulatory Visit: Payer: Self-pay

## 2020-01-12 DIAGNOSIS — C61 Malignant neoplasm of prostate: Secondary | ICD-10-CM | POA: Diagnosis not present

## 2020-01-13 LAB — PSA: Prostate Specific Ag, Serum: <0.1 ng/mL (ref 0.0–4.0)

## 2020-01-15 ENCOUNTER — Ambulatory Visit (INDEPENDENT_AMBULATORY_CARE_PROVIDER_SITE_OTHER): Payer: PPO | Admitting: Physician Assistant

## 2020-01-15 ENCOUNTER — Other Ambulatory Visit: Payer: Self-pay

## 2020-01-15 ENCOUNTER — Encounter: Payer: Self-pay | Admitting: Physician Assistant

## 2020-01-15 VITALS — BP 158/72 | HR 62 | Temp 95.5°F | Wt 198.0 lb

## 2020-01-15 DIAGNOSIS — I739 Peripheral vascular disease, unspecified: Secondary | ICD-10-CM | POA: Diagnosis not present

## 2020-01-15 DIAGNOSIS — E1121 Type 2 diabetes mellitus with diabetic nephropathy: Secondary | ICD-10-CM

## 2020-01-15 DIAGNOSIS — J449 Chronic obstructive pulmonary disease, unspecified: Secondary | ICD-10-CM

## 2020-01-15 DIAGNOSIS — I130 Hypertensive heart and chronic kidney disease with heart failure and stage 1 through stage 4 chronic kidney disease, or unspecified chronic kidney disease: Secondary | ICD-10-CM

## 2020-01-15 DIAGNOSIS — C61 Malignant neoplasm of prostate: Secondary | ICD-10-CM | POA: Insufficient documentation

## 2020-01-15 DIAGNOSIS — I129 Hypertensive chronic kidney disease with stage 1 through stage 4 chronic kidney disease, or unspecified chronic kidney disease: Secondary | ICD-10-CM | POA: Diagnosis not present

## 2020-01-15 DIAGNOSIS — N184 Chronic kidney disease, stage 4 (severe): Secondary | ICD-10-CM | POA: Diagnosis not present

## 2020-01-15 DIAGNOSIS — E782 Mixed hyperlipidemia: Secondary | ICD-10-CM

## 2020-01-15 DIAGNOSIS — D692 Other nonthrombocytopenic purpura: Secondary | ICD-10-CM | POA: Diagnosis not present

## 2020-01-15 DIAGNOSIS — Z794 Long term (current) use of insulin: Secondary | ICD-10-CM | POA: Diagnosis not present

## 2020-01-15 LAB — POCT GLYCOSYLATED HEMOGLOBIN (HGB A1C): Hemoglobin A1C: 6.3 % — AB (ref 4.0–5.6)

## 2020-01-15 NOTE — Progress Notes (Signed)
Patient: Erik Gibbs Male    DOB: October 29, 1935   85 y.o.   MRN: [id number] Visit Date: 01/15/2020  Today's Provider: Mar Daring, PA-C   Chief Complaint  Patient presents with  . Diabetes  . Hypertension  . Hyperlipidemia   Subjective:     HPI   Diabetes Mellitus Type II, Follow-up:   Lab Results  Component Value Date   HGBA1C 6.3 (A) 01/15/2020   HGBA1C 7.0 (A) 10/11/2019   HGBA1C 7.4 (A) 07/10/2019   Last seen for diabetes 3 months ago.  Management since then includes No changes. He reports excellent compliance with treatment. He is not having side effects.  Current symptoms include none and have been stable. Home blood sugar records: fasting range: 90-100's  Episodes of hypoglycemia? no   Current Insulin Regimen: 26 units BID Most Recent Eye Exam: n/a Weight trend: stable Current diet: in general, a "healthy" diet   Current exercise: none  ------------------------------------------------------------------------   Hypertension, follow-up:  BP Readings from Last 3 Encounters:  01/15/20 (!) 158/72  10/11/19 (!) 150/74  07/13/19 (!) 170/53    He was last seen for hypertension 3 months ago.  BP at that visit was 150/74. Management since that visit includes no changes He reports excellent compliance with treatment. He is not having side effects.  He is not exercising. He is adherent to low salt diet.   Outside blood pressures are normal; 118/84 this morning. He is experiencing none.  Patient denies chest pain, irregular heart beat, palpitations, syncope and tachypnea.   Cardiovascular risk factors include advanced age (older than 61 for men, 58 for women), diabetes mellitus, dyslipidemia, hypertension and male gender.  Use of agents associated with hypertension: none.   ------------------------------------------------------------------------    Lipid/Cholesterol, Follow-up:   Last seen for this 3 months ago.  Management since that  visit includes no changes.  Last Lipid Panel:    Component Value Date/Time   CHOL 121 06/29/2018 0805   TRIG 101 06/29/2018 0805   HDL 42 06/29/2018 0805   CHOLHDL 2.9 06/29/2018 0805   LDLCALC 59 06/29/2018 0805    He reports excellent compliance with treatment. He is not having side effects.   Wt Readings from Last 3 Encounters:  01/15/20 198 lb (89.8 kg)  10/11/19 201 lb 6.4 oz (91.4 kg)  07/13/19 203 lb (92.1 kg)    ------------------------------------------------------------------------   Allergies  Allergen Reactions  . Amlodipine     BP drops severly  . Nitrofuran Derivatives     Unknown reaction  . Povidone-Iodine Rash     Current Outpatient Medications:  .  apalutamide (ERLEADA) 60 MG tablet, Take 4 tablets (240 mg total) by mouth daily. May be taken with or without food. Swallow tablets whole., Disp: 4 tablet, Rfl: 11 .  aspirin 81 MG tablet, Take 81 mg by mouth daily.  , Disp: , Rfl:  .  Cholecalciferol 25 MCG (1000 UT) tablet, Take 1,000 Units by mouth daily. , Disp: , Rfl:  .  cilostazol (PLETAL) 100 MG tablet, Take 1 tablet (100 mg total) by mouth 2 (two) times daily., Disp: 180 tablet, Rfl: 3 .  Cyanocobalamin (B-12 PO), Take 1,000 mcg by mouth daily. , Disp: , Rfl:  .  docusate sodium (COLACE) 50 MG capsule, Take 50 mg by mouth 2 (two) times daily. , Disp: , Rfl:  .  famotidine (PEPCID) 20 MG tablet, Take 1 tablet (20 mg total) by mouth 2 (two) times daily.,  Disp: 180 tablet, Rfl: 3 .  FREESTYLE LITE test strip, USE 1 STRIP TO CHECK GLUCOSE TWICE DAILY AS NEEDED, Disp: 200 each, Rfl: 3 .  gabapentin (NEURONTIN) 300 MG capsule, Take 1 capsule (300 mg total) by mouth at bedtime., Disp: 90 capsule, Rfl: 3 .  insulin NPH-insulin regular (NOVOLIN 70/30) (70-30) 100 UNIT/ML injection, Inject 26 Units into the skin 2 (two) times daily with a meal.  , Disp: , Rfl:  .  ipratropium-albuterol (DUONEB) 0.5-2.5 (3) MG/3ML SOLN, USE 1 AMPULE IN NEBULIZER EVERY 4 HOURS  AS NEEDED, Disp: 1080 mL, Rfl: 5 .  iron polysaccharides (NIFEREX) 150 MG capsule, Take 150 mg by mouth 3 (three) times daily., Disp: , Rfl:  .  Lancets (FREESTYLE) lancets, USE 1 LANCET TO CHECK GLUCOSE TWICE DAILY, Disp: 200 each, Rfl: 3 .  lisinopril (ZESTRIL) 10 MG tablet, Take 1 tablet (10 mg total) by mouth daily., Disp: 90 tablet, Rfl: 3 .  loratadine (CLARITIN) 10 MG tablet, Take 10 mg by mouth daily.  , Disp: , Rfl:  .  montelukast (SINGULAIR) 10 MG tablet, Take 1 tablet (10 mg total) by mouth at bedtime., Disp: 90 tablet, Rfl: 3 .  niacin 500 MG CR capsule, Take 1,000 mg by mouth every evening. , Disp: , Rfl:  .  Omega-3 Fatty Acids (FISH OIL) 1200 MG CAPS, Take 1,200 mg by mouth daily. , Disp: , Rfl:  .  pyridOXINE (VITAMIN B-6) 100 MG tablet, Take 200 mg by mouth daily. , Disp: , Rfl:  .  simvastatin (ZOCOR) 20 MG tablet, Take 1 tablet (20 mg total) by mouth every evening., Disp: 90 tablet, Rfl: 3 .  SYMBICORT 160-4.5 MCG/ACT inhaler, Inhale 2 puffs by mouth twice daily, Disp: 11 g, Rfl: 5 .  torsemide (DEMADEX) 10 MG tablet, Take 1 tablet (10 mg total) by mouth daily., Disp: 90 tablet, Rfl: 3  Current Facility-Administered Medications:  .  gemcitabine (GEMZAR) 2,000 mg in sodium chloride irrigation 0.9 % chemo infusion, 2,000 mg, Irrigation, Once, Hollice Espy, MD  Review of Systems  Constitutional: Negative.   Respiratory: Negative.   Cardiovascular: Negative.   Gastrointestinal: Negative.   Musculoskeletal: Negative.   Neurological: Negative for dizziness, light-headedness and headaches.    Social History   Tobacco Use  . Smoking status: Former Smoker    Packs/day: 3.00    Years: 62.00    Pack years: 186.00    Types: Cigarettes    Quit date: 08/08/2010    Years since quitting: 9.4  . Smokeless tobacco: Never Used  Substance Use Topics  . Alcohol use: No    Comment: former heavy alcohol use      Objective:   BP (!) 158/72 (BP Location: Left Arm, Patient  Position: Sitting, Cuff Size: Large)   Pulse 62   Temp (!) 95.5 F (35.3 C) (Temporal)   Wt 198 lb (89.8 kg)   BMI 30.11 kg/m  Vitals:   01/15/20 0812  BP: (!) 158/72  Pulse: 62  Temp: (!) 95.5 F (35.3 C)  TempSrc: Temporal  Weight: 198 lb (89.8 kg)  Body mass index is 30.11 kg/m.   Physical Exam Vitals reviewed.  Constitutional:      General: He is not in acute distress.    Appearance: Normal appearance. He is well-developed. He is obese. He is not ill-appearing or diaphoretic.  HENT:     Head: Normocephalic and atraumatic.  Cardiovascular:     Rate and Rhythm: Normal rate and regular rhythm.  Pulses: Normal pulses.     Heart sounds: Normal heart sounds. No murmur. No friction rub. No gallop.   Pulmonary:     Effort: Pulmonary effort is normal. No respiratory distress.     Breath sounds: Decreased breath sounds and wheezing present. No rales.  Musculoskeletal:     Cervical back: Normal range of motion and neck supple.     Right lower leg: Edema (trace pitting) present.     Left lower leg: Edema (trace pitting) present.  Neurological:     Mental Status: He is alert.      Results for orders placed or performed in visit on 01/15/20  POCT glycosylated hemoglobin (Hb A1C)  Result Value Ref Range   Hemoglobin A1C 6.3 (A) 4.0 - 5.6 %       Assessment & Plan    1. Type 2 diabetes mellitus with diabetic nephropathy, with long-term current use of insulin (HCC) A1c much improved to 6.3 from 7.0. Continue Novolin 70/30 26 units BID.Return in 3 months for AWV.   - POCT glycosylated hemoglobin (Hb A1C)  2. Combined fat and carbohydrate induced hyperlipemia Continue Simvastatin 20mg . Due for labs at next check.   3. Chronic obstructive pulmonary disease, unspecified COPD type ([place]) Stable. Continue Duoneb, montelukast, and Symbicort.   4. Heart & renal disease, hypertensive, with heart failure (HCC) Stable. Continue Lisinopril 10mg  and torsemide 10mg  daily.   5.  Benign hypertension with CKD (chronic kidney disease) stage IV (HCC) Stable. Followed by Dr. Candiss Norse, Nephrology.  6. Peripheral vascular disease, unspecified ([place]) Stable. Continue Pletal 100mg  BID and ASA 81mg .   7. Senile purpura (HCC) Stable.   8. Prostate cancer ([place]) Stable on Erleada. Followed by Dr. Erlene Quan, Urology.      Mar Daring, PA-C  [facility]

## 2020-01-15 NOTE — Patient Instructions (Signed)
Type 2 Diabetes Mellitus, Self Care, Adult When you have type 2 diabetes (type 2 diabetes mellitus), you must make sure your blood sugar (glucose) stays in a healthy range. You can do this with:  Nutrition.  Exercise.  Lifestyle changes.  Medicines or insulin, if needed.  Support from your doctors and others. How to stay aware of blood sugar   Check your blood sugar level every day, as often as told.  Have your A1c (hemoglobin A1c) level checked two or more times a year. Have it checked more often if your doctor tells you to. Your doctor will set personal treatment goals for you. Generally, you should have these blood sugar levels:  Before meals (preprandial): 80-130 mg/dL (4.4-7.2 mmol/L).  After meals (postprandial): below 180 mg/dL (10 mmol/L).  A1c level: less than 7%. How to manage high and low blood sugar Signs of high blood sugar High blood sugar is called hyperglycemia. Know the signs of high blood sugar. Signs may include:  Feeling: ? Thirsty. ? Hungry. ? Very tired.  Needing to pee (urinate) more than usual.  Blurry vision. Signs of low blood sugar Low blood sugar is called hypoglycemia. This is when blood sugar is at or below 70 mg/dL (3.9 mmol/L). Signs may include:  Feeling: ? Hungry. ? Worried or nervous (anxious). ? Sweaty and clammy. ? Confused. ? Dizzy. ? Sleepy. ? Sick to your stomach (nauseous).  Having: ? A fast heartbeat. ? A headache. ? A change in your vision. ? Jerky movements that you cannot control (seizure). ? Tingling or no feeling (numbness) around your mouth, lips, or tongue.  Having trouble with: ? Moving (coordination). ? Sleeping. ? Passing out (fainting). ? Getting upset easily (irritability). Treating low blood sugar To treat low blood sugar, eat or drink something sugary right away. If you can think clearly and swallow safely, follow the 15:15 rule:  Take 15 grams of a fast-acting carb (carbohydrate). Talk with your  doctor about how much you should take.  Some fast-acting carbs are: ? Sugar tablets (glucose pills). Take 3-4 pills. ? 6-8 pieces of hard candy. ? 4-6 oz (120-150 mL) of fruit juice. ? 4-6 oz (120-150 mL) of regular (not diet) soda. ? 1 Tbsp (15 mL) honey or sugar.  Check your blood sugar 15 minutes after you take the carb.  If your blood sugar is still at or below 70 mg/dL (3.9 mmol/L), take 15 grams of a carb again.  If your blood sugar does not go above 70 mg/dL (3.9 mmol/L) after 3 tries, get help right away.  After your blood sugar goes back to normal, eat a meal or a snack within 1 hour. Treating very low blood sugar If your blood sugar is at or below 54 mg/dL (3 mmol/L), you have very low blood sugar (severe hypoglycemia). This is an emergency. Do not wait to see if the symptoms will go away. Get medical help right away. Call your local emergency services (911 in the [place] If you have very low blood sugar and you cannot eat or drink, you may need a glucagon shot (injection). A family member or friend should learn how to check your blood sugar and how to give you a glucagon shot. Ask your doctor if you need to have a glucagon shot kit at home. Follow these instructions at home: Medicine  Take insulin and diabetes medicines as told.  If your doctor says you should take more or less insulin and medicines, do this exactly as told.  Do not run out of insulin or medicines. Having diabetes can raise your risk for other long-term conditions. These include heart disease and kidney disease. Your doctor may prescribe medicines to help you not have these problems. Food   Make healthy food choices. These include: ? Chicken, fish, egg whites, and beans. ? Oats, whole wheat, bulgur, brown rice, quinoa, and millet. ? Fresh fruits and vegetables. ? Low-fat dairy products. ? Nuts, avocado, olive oil, and canola oil.  Meet with a food specialist (dietitian). He or she can help you make an  eating plan that is right for you.  Follow instructions from your doctor about what you cannot eat or drink.  Drink enough fluid to keep your pee (urine) pale yellow.  Keep track of carbs that you eat. Do this by reading food labels and learning food serving sizes.  Follow your sick day plan when you cannot eat or drink normally. Make this plan with your doctor so it is ready to use. Activity  Exercise 3 or more times a week.  Do not go more than 2 days without exercising.  Talk with your doctor before you start a new exercise. Your doctor may need to tell you to change: ? How much insulin or medicines you take. ? How much food you eat. Lifestyle  Do not use any tobacco products. These include cigarettes, chewing tobacco, and e-cigarettes. If you need help quitting, ask your doctor.  Ask your doctor how much alcohol is safe for you.  Learn to deal with stress. If you need help with this, ask your doctor. Body care   Stay up to date with your shots (immunizations).  Have your eyes and feet checked by a doctor as often as told.  Check your skin and feet every day. Check for cuts, bruises, redness, blisters, or sores.  Brush your teeth and gums two times a day. Floss one or more times a day.  Go to the dentist one or more times every 6 months.  Stay at a healthy weight. General instructions  Take over-the-counter and prescription medicines only as told by your doctor.  Share your diabetes care plan with: ? Your work or school. ? People you live with.  Carry a card or wear jewelry that says you have diabetes.  Keep all follow-up visits as told by your doctor. This is important. Questions to ask your doctor  Do I need to meet with a diabetes educator?  Where can I find a support group for people with diabetes? Where to find more information To learn more about diabetes, visit:  American Diabetes Association: www.diabetes.org  American Association of Diabetes  Educators: www.diabeteseducator.org Summary  When you have type 2 diabetes, you must make sure your blood sugar (glucose) stays in a healthy range.  Check your blood sugar every day, as often as told.  Having diabetes can raise your risk for other conditions. Your doctor may prescribe medicines to help you not have these problems.  Keep all follow-up visits as told by your doctor. This is important. This information is not intended to replace advice given to you by your health care provider. Make sure you discuss any questions you have with your health care provider. Document Revised: 05/02/2018 Document Reviewed: 12/13/2015 Elsevier Patient Education  2020 [employer]  

## 2020-01-16 ENCOUNTER — Encounter: Payer: Self-pay | Admitting: Urology

## 2020-01-16 ENCOUNTER — Ambulatory Visit: Payer: PPO | Admitting: Urology

## 2020-01-16 VITALS — BP 165/75 | HR 88 | Ht 68.0 in | Wt 198.0 lb

## 2020-01-16 DIAGNOSIS — Z8551 Personal history of malignant neoplasm of bladder: Secondary | ICD-10-CM | POA: Diagnosis not present

## 2020-01-16 NOTE — Progress Notes (Signed)
   01/16/20  CC:  Chief Complaint  Patient presents with  . Cysto    history of bladder cancer    HPI: 85 year old male with personal history of nonmetastatic castrate resistant prostate cancer and history of bladder cancer who returns today for routine office cystoscopy.  In terms of prostate cancer, please see previous notes.  He has an onset of castrate resistant prostate cancer status post prostatectomy/orchiectomy with a rising PSA.  More recently, he was started on apalutamide with his PSA undetectable levels on 07/11/19 .  He is tolerating the medication quite well.  He also has a personal history of bladder cancer. This was diagnosed in 2007 with unknown pathology although Dr. Bjorn Loser notes indicate CIS of the bladder. It also appears that he had a low-grade noninvasive urothelial carcinoma recurrence in 2018. He did a biopsy performed in the office 04/2018 which was benign.  More recently, he returned to the operating room on 12/26/2018 where the left UO was noted to be capacious.  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  No urinary issues.  Blood pressure (!) 170/53, pulse 66, height 5\' 8"  (1.727 m), weight 203 lb (92.1 kg NED. A&Ox3.   No respiratory distress   Abd soft, NT, ND Normal phallus with bilateral descended testicles  Cystoscopy Procedure Note  Patient identification was confirmed, informed consent was obtained, and patient was prepped using Betadine solution.  Lidocaine jelly was administered per urethral meatus.     Pre-Procedure: - Inspection reveals a normal caliber ureteral meatus.  Procedure: The flexible cystoscope was introduced without difficulty - No urethral strictures/lesions are present. - Surgically absent prostate  - Normal bladder neck with few small subtle areas of nonspecific erythema, stable - Bilateral ureteral orifices  identified, left is stably capacious - Bladder mucosa  reveals no ulcers, tumors, or lesions - No bladder stones - No trabeculation  Retroflexion shows unremarkable.   Post-Procedure: - Patient tolerated the procedure well  Assessment/ Plan:  1. History of bladder cancer Given previous atypia, will continue to follow on a every 6 month basis Urine cytology today He is agreeable this plan - Urinalysis, Complete  2. Prostate cancer ([place]) Most recent PSA 01/2019 undetectable on apalutamide/orchiectomy Tolerating well- continue this medication PSA remains undetectable Continue this medication  PSA in 6 months  Return in about 6 months (around 07/15/2020) for cysto/ PSA.  Hollice Espy, MD

## 2020-01-16 NOTE — Progress Notes (Signed)
In and Out Catheterization  Patient is present today for a I & O catheterization due to UA. Patient was cleaned and prepped in a sterile fashion with hippacleanse . A 14FR cath was inserted no complications were noted , 54ml of urine return was noted, urine was yellow in color. A clean urine sample was collected for UA. Bladder was drained  And catheter was removed with out difficulty.    Performed by: Verlene Mayer, CMA and Kerman Passey, CMA

## 2020-01-17 LAB — MICROSCOPIC EXAMINATION: RBC, Urine: NONE SEEN /hpf (ref 0–2)

## 2020-01-17 LAB — URINALYSIS, COMPLETE
Bilirubin, UA: NEGATIVE
Glucose, UA: NEGATIVE
Ketones, UA: NEGATIVE
Leukocytes,UA: NEGATIVE
Nitrite, UA: NEGATIVE
Protein,UA: NEGATIVE
RBC, UA: NEGATIVE
Specific Gravity, UA: 1.015 (ref 1.005–1.030)
Urobilinogen, Ur: 0.2 mg/dL (ref 0.2–1.0)
pH, UA: 5 (ref 5.0–7.5)

## 2020-01-22 ENCOUNTER — Other Ambulatory Visit: Payer: Self-pay | Admitting: Urology

## 2020-01-25 ENCOUNTER — Other Ambulatory Visit: Payer: Self-pay | Admitting: Physician Assistant

## 2020-01-25 DIAGNOSIS — G629 Polyneuropathy, unspecified: Secondary | ICD-10-CM

## 2020-01-25 DIAGNOSIS — E1121 Type 2 diabetes mellitus with diabetic nephropathy: Secondary | ICD-10-CM

## 2020-02-23 ENCOUNTER — Other Ambulatory Visit: Payer: Self-pay | Admitting: Physician Assistant

## 2020-02-23 DIAGNOSIS — Z794 Long term (current) use of insulin: Secondary | ICD-10-CM

## 2020-02-23 DIAGNOSIS — E1121 Type 2 diabetes mellitus with diabetic nephropathy: Secondary | ICD-10-CM

## 2020-02-23 DIAGNOSIS — G629 Polyneuropathy, unspecified: Secondary | ICD-10-CM

## 2020-02-23 NOTE — Telephone Encounter (Signed)
Requested Prescriptions  Pending Prescriptions Disp Refills  . cilostazol (PLETAL) 100 MG tablet [Pharmacy Med Name: Cilostazol 100 MG Oral Tablet] 60 tablet 0    Sig: Take 1 tablet by mouth twice daily     Hematology: Antiplatelets - cilostazol Failed - 02/23/2020  9:17 AM      Failed - HCT in normal range and within 180 days    Hematocrit  Date Value Ref Range Status  09/28/2018 31.5 (L) 37.5 - 51.0 % Final         Failed - HGB in normal range and within 180 days    Hemoglobin  Date Value Ref Range Status  09/28/2018 10.6 (L) 13.0 - 17.7 g/dL Final         Failed - PLT in normal range and within 180 days    Platelets  Date Value Ref Range Status  09/28/2018 229 150 - 450 x10E3/uL Final         Failed - WBC in normal range and within 180 days    WBC  Date Value Ref Range Status  09/28/2018 6.3 3.4 - 10.8 x10E3/uL Final  08/08/2014 5.8 4.0 - 10.5 K/uL Final         Passed - Valid encounter within last 6 months    Recent Outpatient Visits          1 month ago Type 2 diabetes mellitus with diabetic nephropathy, with long-term current use of insulin [facility])   [place], [place], PA-C   4 months ago Type 2 diabetes mellitus with diabetic nephropathy, with long-term current use of insulin [facility])   [place], [place], [place]   7 months ago Type 2 diabetes mellitus with diabetic nephropathy, with long-term current use of insulin [facility])   [place], [place], [place]   10 months ago Type 2 diabetes mellitus with diabetic nephropathy, with long-term current use of insulin [facility])   [place], [place], [place]   1 year ago Type 2 diabetes mellitus with diabetic nephropathy, with long-term current use of insulin [facility])   [place], [place], [place]

## 2020-02-28 ENCOUNTER — Other Ambulatory Visit: Payer: Self-pay | Admitting: Physician Assistant

## 2020-02-28 DIAGNOSIS — E1121 Type 2 diabetes mellitus with diabetic nephropathy: Secondary | ICD-10-CM

## 2020-03-13 ENCOUNTER — Other Ambulatory Visit: Payer: Self-pay | Admitting: Physician Assistant

## 2020-03-13 DIAGNOSIS — J449 Chronic obstructive pulmonary disease, unspecified: Secondary | ICD-10-CM

## 2020-03-13 NOTE — Telephone Encounter (Signed)
Requested Prescriptions  Pending Prescriptions Disp Refills  . ipratropium-albuterol (DUONEB) 0.5-2.5 (3) MG/3ML SOLN [Pharmacy Med Name: Ipratropium-Albuterol 0.5-2.5 (3) MG/3ML Inhalation Solution] 1080 mL 0    Sig: USE 1 AMPULE IN NEBULIZER EVERY 4 HOURS AS NEEDED     Pulmonology:  Combination Products Passed - 03/13/2020  9:55 AM      Passed - Valid encounter within last 12 months    Recent Outpatient Visits          1 month ago Type 2 diabetes mellitus with diabetic nephropathy, with long-term current use of insulin [facility])   [facility] [place], [place], PA-C   5 months ago Type 2 diabetes mellitus with diabetic nephropathy, with long-term current use of insulin [facility])   [facility] [place], [place], [place]   8 months ago Type 2 diabetes mellitus with diabetic nephropathy, with long-term current use of insulin [facility])   [facility] [place], [place], [place]   11 months ago Type 2 diabetes mellitus with diabetic nephropathy, with long-term current use of insulin [facility])   [place], [place], [place]   1 year ago Type 2 diabetes mellitus with diabetic nephropathy, with long-term current use of insulin [facility])   [facility] [place], [place], [place]

## 2020-03-14 DIAGNOSIS — N1832 Chronic kidney disease, stage 3b: Secondary | ICD-10-CM | POA: Diagnosis not present

## 2020-03-14 DIAGNOSIS — I10 Essential (primary) hypertension: Secondary | ICD-10-CM | POA: Diagnosis not present

## 2020-03-14 DIAGNOSIS — E1129 Type 2 diabetes mellitus with other diabetic kidney complication: Secondary | ICD-10-CM | POA: Diagnosis not present

## 2020-03-25 ENCOUNTER — Telehealth: Payer: Self-pay

## 2020-03-25 NOTE — Telephone Encounter (Signed)
Called to schedule AWV prior to CPE and wife stated they do not want to complete an AWV and that she wants to be removed from our call list. Note made. FYI to PCP!

## 2020-04-18 ENCOUNTER — Other Ambulatory Visit: Payer: Self-pay

## 2020-04-18 ENCOUNTER — Encounter: Payer: Self-pay | Admitting: Physician Assistant

## 2020-04-18 ENCOUNTER — Ambulatory Visit (INDEPENDENT_AMBULATORY_CARE_PROVIDER_SITE_OTHER): Payer: PPO | Admitting: Physician Assistant

## 2020-04-18 VITALS — BP 131/64 | HR 71 | Temp 96.8°F | Wt 198.0 lb

## 2020-04-18 DIAGNOSIS — Z0000 Encounter for general adult medical examination without abnormal findings: Secondary | ICD-10-CM | POA: Diagnosis not present

## 2020-04-18 DIAGNOSIS — E1121 Type 2 diabetes mellitus with diabetic nephropathy: Secondary | ICD-10-CM | POA: Diagnosis not present

## 2020-04-18 DIAGNOSIS — G629 Polyneuropathy, unspecified: Secondary | ICD-10-CM

## 2020-04-18 DIAGNOSIS — N184 Chronic kidney disease, stage 4 (severe): Secondary | ICD-10-CM

## 2020-04-18 DIAGNOSIS — J449 Chronic obstructive pulmonary disease, unspecified: Secondary | ICD-10-CM | POA: Diagnosis not present

## 2020-04-18 DIAGNOSIS — Z794 Long term (current) use of insulin: Secondary | ICD-10-CM | POA: Diagnosis not present

## 2020-04-18 DIAGNOSIS — I129 Hypertensive chronic kidney disease with stage 1 through stage 4 chronic kidney disease, or unspecified chronic kidney disease: Secondary | ICD-10-CM

## 2020-04-18 LAB — POCT GLYCOSYLATED HEMOGLOBIN (HGB A1C)
Est. average glucose Bld gHb Est-mCnc: 134
Hemoglobin A1C: 6.3 % — AB (ref 4.0–5.6)

## 2020-04-18 MED ORDER — CILOSTAZOL 100 MG PO TABS: 100.0000 mg | ORAL_TABLET | Freq: Two times a day (BID) | ORAL | 1 refills | Status: DC

## 2020-04-18 MED ORDER — IPRATROPIUM-ALBUTEROL 0.5-2.5 (3) MG/3ML IN SOLN: RESPIRATORY_TRACT | 1 refills | Status: DC

## 2020-04-18 NOTE — Progress Notes (Signed)
Annual Wellness Visit     Patient: Erik Gibbs, Male    DOB: Apr 06, 1935, 85 y.o.   MRN: [id number] Visit Date: 04/18/2020  Today's Provider: Mar Daring, PA-C   Chief Complaint  Patient presents with  . Annual Exam  . Diabetes   Subjective    Erik Gibbs is a 85 y.o. male who presents today for his Annual Wellness Visit. He reports consuming a general diet. The patient does not participate in regular exercise at present. He generally feels well. He reports sleeping well. He does not have additional problems to discuss today.   HPI Type 2 diabetes mellitus with diabetic nephropathy:This is chronic problem. Continue Novolin 70/30 26 units BID.  Hypertension:Stable. Followed by Dr. Candiss Norse, Nephrology  Patient Active Problem List   Diagnosis Date Noted  . Prostate cancer ([place]) 01/15/2020  . COPD (chronic obstructive pulmonary disease) ([place]) 12/16/2017  . Neuropathy 06/10/2015  . Diabetes mellitus with diabetic nephropathy ([place]) 06/10/2015  . Senile purpura ([place]) 06/10/2015  . Anemia, iron deficiency 04/09/2015  . Adult BMI 30+ 04/09/2015  . Chronic kidney disease (CKD), stage III (moderate) 04/09/2015  . Gastric ulcer 04/09/2015  . Rash and nonspecific skin eruption 09/05/2014  . Bladder CA in situ 12/13/2012  . TOBACCO ABUSE 11/05/2009  . Benign hypertension with CKD (chronic kidney disease) stage IV ([place]) 04/18/2009  . PVD 04/18/2009  . GERD 04/18/2009  . Combined fat and carbohydrate induced hyperlipemia 09/28/2008  . Allergic rhinitis 12/26/2007  . CAFL (chronic airflow limitation) ([place]) 12/26/2007  . H/O primary malignant neoplasm of urinary bladder 12/26/2007  . History of colon polyps 12/26/2007  . H/O malignant neoplasm of prostate 12/26/2007  . Heart & renal disease, hypertensive, with heart failure ([place]) 12/26/2007  . Angiopathy, peripheral ([place]) 12/26/2007   Past Medical History:  Diagnosis Date  . Anemia   . Chronic kidney disease   .  COPD (chronic obstructive pulmonary disease) ([place])   . Diabetes mellitus   . Dyspnea   . GERD (gastroesophageal reflux disease)   . Hyperlipidemia, mixed   . Hypertension   . Neuromuscular disorder ([place])   . PVD (peripheral vascular disease) ([place])    Past Surgical History:  Procedure Laterality Date  . BLADDER TUMOR EXCISION  2007  . CYSTOSCOPY W/ RETROGRADES Bilateral 12/26/2018   Procedure: CYSTOSCOPY WITH RETROGRADE PYELOGRAM;  Surgeon: Hollice Espy, MD;  Location: [facility] ORS;  Service: Urology;  Laterality: Bilateral;  . EYE SURGERY Bilateral    Cataract  . PROSTATECTOMY  1995  . TESTICLE REMOVAL Bilateral    Dr. August Saucer  . TRANSURETHRAL RESECTION OF BLADDER TUMOR WITH MITOMYCIN-C N/A 12/26/2018   Procedure: TRANSURETHRAL RESECTION OF BLADDER TUMOR WITH Gemcitabine;  Surgeon: Hollice Espy, MD;  Location: [facility] ORS;  Service: Urology;  Laterality: N/A;   Social History   Tobacco Use  . Smoking status: Former Smoker    Packs/day: 3.00    Years: 62.00    Pack years: 186.00    Types: Cigarettes    Quit date: 08/08/2010    Years since quitting: 9.7  . Smokeless tobacco: Never Used  Substance Use Topics  . Alcohol use: No    Comment: former heavy alcohol use  . Drug use: No   Social History   Socioeconomic History  . Marital status: Married    Spouse name: [name]  . Number of children: 4  . Years of education: 6th Grade  . Highest education level: Not on file  Occupational History  .  Occupation: Retired  Tobacco Use  . Smoking status: Former Smoker    Packs/day: 3.00    Years: 62.00    Pack years: 186.00    Types: Cigarettes    Quit date: 08/08/2010    Years since quitting: 9.7  . Smokeless tobacco: Never Used  Substance and Sexual Activity  . Alcohol use: No    Comment: former heavy alcohol use  . Drug use: No  . Sexual activity: Not Currently  Other Topics Concern  . Not on file  Social History Narrative   Married   Gets regular exercise   Social  Determinants of Health   Financial Resource Strain:   . Difficulty of Paying Living Expenses:   Food Insecurity:   . Worried About Charity fundraiser in the Last Year:   . Arboriculturist in the Last Year:   Transportation Needs:   . Film/video editor (Medical):   Marland Kitchen Lack of Transportation (Non-Medical):   Physical Activity:   . Days of Exercise per Week:   . Minutes of Exercise per Session:   Stress:   . Feeling of Stress :   Social Connections:   . Frequency of Communication with Friends and Family:   . Frequency of Social Gatherings with Friends and Family:   . Attends Religious Services:   . Active Member of Clubs or Organizations:   . Attends Archivist Meetings:   Marland Kitchen Marital Status:   Intimate Partner Violence:   . Fear of Current or Ex-Partner:   . Emotionally Abused:   Marland Kitchen Physically Abused:   . Sexually Abused:    Family Status  Relation Name Status  . Brother  Deceased at age 15  . Father  Deceased at age 68  . Mother  Deceased at age 65       cause of death was perforated ulcer  . Sister  Other       poor releationship- health status unknown  . Other  (Not Specified)  . Son  (Not Specified)   Family History  Problem Relation Age of Onset  . COPD Brother   . Diabetes Brother   . Heart attack Brother   . Emphysema Father   . Cancer Father        prostate  . Depression Mother   . Heart disease Mother   . Ulcers Mother   . Diabetes Other   . Heart failure Other   . Asthma Son        childhood   Allergies  Allergen Reactions  . Amlodipine     BP drops severly  . Nitrofuran Derivatives     Unknown reaction  . Povidone-Iodine Rash     Medications: Outpatient Medications Prior to Visit  Medication Sig  . apalutamide (ERLEADA) 60 MG tablet Take 4 tablets (240 mg total) by mouth daily. May be taken with or without food. Swallow tablets whole.  Marland Kitchen aspirin 81 MG tablet Take 81 mg by mouth daily.    . Cholecalciferol 25 MCG (1000 UT)  tablet Take 1,000 Units by mouth daily.   . cilostazol (PLETAL) 100 MG tablet Take 1 tablet by mouth twice daily  . Cyanocobalamin (B-12 PO) Take 1,000 mcg by mouth daily.   Marland Kitchen docusate sodium (COLACE) 50 MG capsule Take 50 mg by mouth 2 (two) times daily.   . famotidine (PEPCID) 20 MG tablet Take 1 tablet (20 mg total) by mouth 2 (two) times daily.  Marland Kitchen FREESTYLE LITE test  strip USE 1 STRIP TO CHECK GLUCOSE TWICE DAILY AS NEEDED  . gabapentin (NEURONTIN) 300 MG capsule Take 1 capsule (300 mg total) by mouth at bedtime.  . insulin NPH-insulin regular (NOVOLIN 70/30) (70-30) 100 UNIT/ML injection Inject 26 Units into the skin 2 (two) times daily with a meal.    . ipratropium-albuterol (DUONEB) 0.5-2.5 (3) MG/3ML SOLN USE 1 AMPULE IN NEBULIZER EVERY 4 HOURS AS NEEDED  . iron polysaccharides (NIFEREX) 150 MG capsule Take 150 mg by mouth 3 (three) times daily.  . Lancets (FREESTYLE) lancets USE 1 LANCET TO CHECK GLUCOSE TWICE DAILY  . lisinopril (ZESTRIL) 10 MG tablet Take 1 tablet (10 mg total) by mouth daily.  Marland Kitchen loratadine (CLARITIN) 10 MG tablet Take 10 mg by mouth daily.    . montelukast (SINGULAIR) 10 MG tablet Take 1 tablet (10 mg total) by mouth at bedtime.  . niacin 500 MG CR capsule Take 1,000 mg by mouth every evening.   . Omega-3 Fatty Acids (FISH OIL) 1200 MG CAPS Take 1,200 mg by mouth daily.   Marland Kitchen pyridOXINE (VITAMIN B-6) 100 MG tablet Take 200 mg by mouth daily.   . simvastatin (ZOCOR) 20 MG tablet Take 1 tablet (20 mg total) by mouth every evening.  . SYMBICORT 160-4.5 MCG/ACT inhaler Inhale 2 puffs by mouth twice daily  . torsemide (DEMADEX) 10 MG tablet Take 1 tablet (10 mg total) by mouth daily.   Facility-Administered Medications Prior to Visit  Medication Dose Route Frequency Provider  . gemcitabine (GEMZAR) 2,000 mg in sodium chloride irrigation 0.9 % chemo infusion  2,000 mg Irrigation Once Hollice Espy, MD    Allergies  Allergen Reactions  . Amlodipine     BP drops  severly  . Nitrofuran Derivatives     Unknown reaction  . Povidone-Iodine Rash    Patient Care Team: Rubye Beach as PCP - General (Family Medicine)  Review of Systems  Constitutional: Negative.   HENT: Negative.   Eyes: Negative.   Respiratory: Negative.   Cardiovascular: Negative.   Gastrointestinal: Negative.   Endocrine: Negative.   Genitourinary: Negative.   Musculoskeletal: Negative.   Skin: Negative.   Allergic/Immunologic: Positive for environmental allergies. Negative for food allergies and immunocompromised state.  Neurological: Negative.   Hematological: Negative.   Psychiatric/Behavioral: Negative.     Last CBC Lab Results  Component Value Date   WBC 6.3 09/28/2018   HGB 10.6 (L) 09/28/2018   HCT 31.5 (L) 09/28/2018   MCV 96 09/28/2018   MCH 32.3 09/28/2018   RDW 12.6 09/28/2018   PLT 229 09/28/2018      Objective    Vitals: BP 131/64 (BP Location: Right Arm, Patient Position: Sitting, Cuff Size: Large)   Pulse 71   Temp (!) 96.8 F (36 C) (Temporal)   Wt 198 lb (89.8 kg)   BMI 30.11 kg/m  BP Readings from Last 3 Encounters:  04/18/20 131/64  01/16/20 (!) 165/75  01/15/20 (!) 158/72    Physical Exam Vitals reviewed.  Constitutional:      General: He is not in acute distress.    Appearance: Normal appearance. He is well-developed. He is obese. He is not ill-appearing.  HENT:     Head: Normocephalic and atraumatic.     Right Ear: Tympanic membrane, ear canal and external ear normal.     Left Ear: Tympanic membrane, ear canal and external ear normal.  Eyes:     General: No scleral icterus.       Right  eye: No discharge.        Left eye: No discharge.     Extraocular Movements: Extraocular movements intact.     Conjunctiva/sclera: Conjunctivae normal.     Pupils: Pupils are equal, round, and reactive to light.  Neck:     Thyroid: No thyromegaly.     Vascular: No carotid bruit.     Trachea: No tracheal deviation.    Cardiovascular:     Rate and Rhythm: Normal rate and regular rhythm.     Pulses: Normal pulses.     Heart sounds: Normal heart sounds. No murmur.  Pulmonary:     Effort: Pulmonary effort is normal. No respiratory distress.     Breath sounds: Decreased air movement present. Decreased breath sounds present. No wheezing or rales.  Chest:     Chest wall: No tenderness.  Abdominal:     General: Abdomen is flat. There is no distension.     Palpations: Abdomen is soft. There is no mass.     Tenderness: There is no abdominal tenderness. There is no guarding or rebound.  Musculoskeletal:        General: No tenderness. Normal range of motion.     Cervical back: Normal range of motion and neck supple.     Right lower leg: Edema (1+ pitting edema) present.     Left lower leg: Edema (1+ pitting edema) present.  Lymphadenopathy:     Cervical: No cervical adenopathy.  Skin:    General: Skin is warm and dry.     Capillary Refill: Capillary refill takes less than 2 seconds.     Findings: No erythema or rash.  Neurological:     General: No focal deficit present.     Mental Status: He is alert and oriented to person, place, and time. Mental status is at baseline.     Cranial Nerves: No cranial nerve deficit.     Motor: No abnormal muscle tone.     Coordination: Coordination normal.     Deep Tendon Reflexes: Reflexes are normal and symmetric. Reflexes normal.  Psychiatric:        Mood and Affect: Mood normal.        Behavior: Behavior normal.        Thought Content: Thought content normal.        Judgment: Judgment normal.    Diabetic Foot Exam - Simple   Simple Foot Form Diabetic Foot exam was performed with the following findings: Yes 04/18/2020  1:08 PM  Visual Inspection No deformities, no ulcerations, no other skin breakdown bilaterally: Yes Sensation Testing Intact to touch and monofilament testing bilaterally: Yes Pulse Check Posterior Tibialis and Dorsalis pulse intact bilaterally:  Yes Comments     Most recent functional status assessment: No flowsheet data found.  Most recent fall risk assessment: Fall Risk  06/23/2018  Falls in the past year? No     Most recent depression screenings: PHQ 2/9 Scores 07/10/2019 06/23/2018  PHQ - 2 Score 0 0    Most recent cognitive screening: 6CIT Screen 04/18/2020  What Year? 0 points  What month? 3 points  What time? 0 points  Count back from 20 4 points  Months in reverse 4 points  Repeat phrase 8 points  Total Score 19    Results for orders placed or performed in visit on 04/18/20  POCT glycosylated hemoglobin (Hb A1C)  Result Value Ref Range   Hemoglobin A1C 6.3 (A) 4.0 - 5.6 %    Assessment & Plan  Annual wellness visit done today including the all of the following: Reviewed patient's Family Medical History Reviewed and updated list of patient's medical providers Assessment of cognitive impairment was done Assessed patient's functional ability Established a written schedule for health screening [place] Completed and Reviewed  Exercise Activities and Dietary recommendations Goals    . Exercise 150 minutes per week (moderate activity)       Immunization History  Administered Date(s) Administered  . Fluad Quad(high Dose 65+) 08/01/2019  . Influenza Split 09/28/2008, 09/26/2009, 10/08/2010, 09/02/2011, 08/31/2012  . Influenza, High Dose Seasonal PF 08/12/2015, 09/09/2017, 08/03/2018  . Influenza,inj,Quad PF,6+ Mos 09/11/2013, 08/08/2014  . Influenza-Unspecified 07/23/2016  . PFIZER SARS-COV-2 Vaccination 12/19/2019, 01/09/2020  . Pneumococcal Conjugate-13 09/05/2014  . Pneumococcal Polysaccharide-23 11/16/2007, 07/23/2016  . Pneumococcal-Unspecified 08/09/2011  . Tdap 02/22/2012    Health Maintenance  Topic Date Due  . OPHTHALMOLOGY EXAM  06/18/2017  . FOOT EXAM  03/04/2018  . INFLUENZA VACCINE  06/23/2020  . HEMOGLOBIN A1C  10/19/2020  . TETANUS/TDAP  02/21/2022  .  COVID-19 Vaccine  Completed  . PNA vac Low Risk Adult  Completed     Discussed health benefits of physical activity, and encouraged him to engage in regular exercise appropriate for his age and condition.    1. Medicare annual wellness visit, subsequent Normal exam. Up to date on screenings and vaccinations.   2. Type 2 diabetes mellitus with diabetic nephropathy, with long-term current use of insulin ([place]) Eye exam in June. A1c stable. Continue Novolin 70/30 26 units BID.  - cilostazol (PLETAL) 100 MG tablet; Take 1 tablet (100 mg total) by mouth 2 (two) times daily.  Dispense: 180 tablet; Refill: 1  3. Chronic obstructive pulmonary disease, unspecified COPD type ([place]) Stable. Diagnosis pulled for medication refill. Continue current medical treatment plan. - ipratropium-albuterol (DUONEB) 0.5-2.5 (3) MG/3ML SOLN; USE 1 AMPULE IN NEBULIZER EVERY 4 HOURS AS NEEDED  Dispense: 1080 mL; Refill: 1  4. Neuropathy Stable. Secondary to T2DM. Continue Gabapentin 300mg  q hs.   5. Benign hypertension with CKD (chronic kidney disease) stage IV ([facility]) Stable. Continue lisinopril 10mg .  No follow-ups on file.     Reynolds Bowl, PA-C, have reviewed all documentation for this visit. The documentation on 04/19/20 for the exam, diagnosis, procedures, and orders are all accurate and complete.   Rubye Beach  [facility] [phone] (phone) [phone] (fax)  [place]

## 2020-04-18 NOTE — Patient Instructions (Signed)
Preventive Care 35 Years and Older, Male Preventive care refers to lifestyle choices and visits with your health care provider that can promote health and wellness. This includes:  A yearly physical exam. This is also called an annual well check.  Regular dental and eye exams.  Immunizations.  Screening for certain conditions.  Healthy lifestyle choices, such as diet and exercise. What can I expect for my preventive care visit? Physical exam Your health care provider will check:  Height and weight. These may be used to calculate body mass index (BMI), which is a measurement that tells if you are at a healthy weight.  Heart rate and blood pressure.  Your skin for abnormal spots. Counseling Your health care provider may ask you questions about:  Alcohol, tobacco, and drug use.  Emotional well-being.  Home and relationship well-being.  Sexual activity.  Eating habits.  History of falls.  Memory and ability to understand (cognition).  Work and work Statistician. What immunizations do I need?  Influenza (flu) vaccine  This is recommended every year. Tetanus, diphtheria, and pertussis (Tdap) vaccine  You may need a Td booster every 10 years. Varicella (chickenpox) vaccine  You may need this vaccine if you have not already been vaccinated. Zoster (shingles) vaccine  You may need this after age 69. Pneumococcal conjugate (PCV13) vaccine  One dose is recommended after age 73. Pneumococcal polysaccharide (PPSV23) vaccine  One dose is recommended after age 64. Measles, mumps, and rubella (MMR) vaccine  You may need at least one dose of MMR if you were born in 1957 or later. You may also need a second dose. Meningococcal conjugate (MenACWY) vaccine  You may need this if you have certain conditions. Hepatitis A vaccine  You may need this if you have certain conditions or if you travel or work in places where you may be exposed to hepatitis A. Hepatitis B  vaccine  You may need this if you have certain conditions or if you travel or work in places where you may be exposed to hepatitis B. Haemophilus influenzae type b (Hib) vaccine  You may need this if you have certain conditions. You may receive vaccines as individual doses or as more than one vaccine together in one shot (combination vaccines). Talk with your health care provider about the risks and benefits of combination vaccines. What tests do I need? Blood tests  Lipid and cholesterol levels. These may be checked every 5 years, or more frequently depending on your overall health.  Hepatitis C test.  Hepatitis B test. Screening  Lung cancer screening. You may have this screening every year starting at age 57 if you have a 30-pack-year history of smoking and currently smoke or have quit within the past 15 years.  Colorectal cancer screening. All adults should have this screening starting at age 26 and continuing until age 45. Your health care provider may recommend screening at age 28 if you are at increased risk. You will have tests every 1-10 years, depending on your results and the type of screening test.  Prostate cancer screening. Recommendations will vary depending on your family history and other risks.  Diabetes screening. This is done by checking your blood sugar (glucose) after you have not eaten for a while (fasting). You may have this done every 1-3 years.  Abdominal aortic aneurysm (AAA) screening. You may need this if you are a current or former smoker.  Sexually transmitted disease (STD) testing. Follow these instructions at home: Eating and drinking  Eat  a diet that includes fresh fruits and vegetables, whole grains, lean protein, and low-fat dairy products. Limit your intake of foods with high amounts of sugar, saturated fats, and salt.  Take vitamin and mineral supplements as recommended by your health care provider.  Do not drink alcohol if your health care  provider tells you not to drink.  If you drink alcohol: ? Limit how much you have to 0-2 drinks a day. ? Be aware of how much alcohol is in your drink. In the [place] one drink equals one 12 oz bottle of beer (355 mL), one 5 oz glass of wine (148 mL), or one 1 oz glass of hard liquor (44 mL). Lifestyle  Take daily care of your teeth and gums.  Stay active. Exercise for at least 30 minutes on 5 or more days each week.  Do not use any products that contain nicotine or tobacco, such as cigarettes, e-cigarettes, and chewing tobacco. If you need help quitting, ask your health care provider.  If you are sexually active, practice safe sex. Use a condom or other form of protection to prevent STIs (sexually transmitted infections).  Talk with your health care provider about taking a low-dose aspirin or statin. What's next?  Visit your health care provider once a year for a well check visit.  Ask your health care provider how often you should have your eyes and teeth checked.  Stay up to date on all vaccines. This information is not intended to replace advice given to you by your health care provider. Make sure you discuss any questions you have with your health care provider. Document Revised: 11/03/2018 Document Reviewed: 11/03/2018 Elsevier Patient Education  2020 [employer].

## 2020-05-07 DIAGNOSIS — Z961 Presence of intraocular lens: Secondary | ICD-10-CM | POA: Diagnosis not present

## 2020-05-07 DIAGNOSIS — D3131 Benign neoplasm of right choroid: Secondary | ICD-10-CM | POA: Diagnosis not present

## 2020-05-07 LAB — HM DIABETES EYE EXAM

## 2020-05-09 ENCOUNTER — Encounter: Payer: Self-pay | Admitting: Physician Assistant

## 2020-05-12 ENCOUNTER — Other Ambulatory Visit: Payer: Self-pay | Admitting: Physician Assistant

## 2020-05-12 DIAGNOSIS — I10 Essential (primary) hypertension: Secondary | ICD-10-CM

## 2020-05-12 NOTE — Telephone Encounter (Signed)
Requested Prescriptions  Pending Prescriptions Disp Refills   lisinopril (ZESTRIL) 10 MG tablet [Pharmacy Med Name: Lisinopril 10 MG Oral Tablet] 90 tablet 0    Sig: Take 1 tablet by mouth once daily     Cardiovascular:  ACE Inhibitors Failed - 05/12/2020 10:15 AM      Failed - Cr in normal range and within 180 days    Creatinine, Ser  Date Value Ref Range Status  09/28/2018 2.37 (H) 0.76 - 1.27 mg/dL Final         Failed - K in normal range and within 180 days    Potassium  Date Value Ref Range Status  09/28/2018 5.0 3.5 - 5.2 mmol/L Final         Passed - Patient is not pregnant      Passed - Last BP in normal range    BP Readings from Last 1 Encounters:  04/18/20 131/64         Passed - Valid encounter within last 6 months    Recent Outpatient Visits          3 months ago Type 2 diabetes mellitus with diabetic nephropathy, with long-term current use of insulin [facility])   [place], [place], PA-C   7 months ago Type 2 diabetes mellitus with diabetic nephropathy, with long-term current use of insulin [facility])   [place], [place], [place]   10 months ago Type 2 diabetes mellitus with diabetic nephropathy, with long-term current use of insulin [facility])   [place], [place], [place]   1 year ago Type 2 diabetes mellitus with diabetic nephropathy, with long-term current use of insulin [facility]   [place], [place], [place]   1 year ago Type 2 diabetes mellitus with diabetic nephropathy, with long-term current use of insulin [facility])   [facility] [place], Cle[place]ce Sorrel, [place]      Future Appointments            In 2 months [place], [place], PA-C [employer], [facility]

## 2020-06-10 ENCOUNTER — Other Ambulatory Visit: Payer: Self-pay | Admitting: Physician Assistant

## 2020-06-10 DIAGNOSIS — J449 Chronic obstructive pulmonary disease, unspecified: Secondary | ICD-10-CM

## 2020-06-10 MED ORDER — IPRATROPIUM-ALBUTEROL 0.5-2.5 (3) MG/3ML IN SOLN: RESPIRATORY_TRACT | 1 refills | Status: DC

## 2020-06-10 NOTE — Telephone Encounter (Signed)
Medication Refill - Medication: ipratropium-albuterol (DUONEB) 0.5-2.5 (3) MG/3ML SOLN    Has the patient contacted their pharmacy? No. (Agent: If no, request that the patient contact the pharmacy for the refill.) (Agent: If yes, when and what did the pharmacy advise?)  Preferred Pharmacy (with phone number or street name):  [place], [place] - [address] Phone:  [phone]  Fax:  [phone]       Agent: Please be advised that RX refills may take up to 3 business days. We ask that you follow-up with your pharmacy.

## 2020-06-10 NOTE — Telephone Encounter (Signed)
Requested Prescriptions  Pending Prescriptions Disp Refills   ipratropium-albuterol (DUONEB) 0.5-2.5 (3) MG/3ML SOLN 1080 mL 1    Sig: USE 1 AMPULE IN NEBULIZER EVERY 4 HOURS AS NEEDED     Pulmonology:  Combination Products Passed - 06/10/2020  9:10 AM      Passed - Valid encounter within last 12 months    Recent Outpatient Visits          4 months ago Type 2 diabetes mellitus with diabetic nephropathy, with long-term current use of insulin [facility])   [place], PA-C   8 months ago Type 2 diabetes mellitus with diabetic nephropathy, with long-term current use of insulin [facility])   [facility] [place], [place], [place]   11 months ago Type 2 diabetes mellitus with diabetic nephropathy, with long-term current use of insulin [facility])   [place], [place], [place]   1 year ago Type 2 diabetes mellitus with diabetic nephropathy, with long-term current use of insulin [facility])   [place], [place], [place]   1 year ago Type 2 diabetes mellitus with diabetic nephropathy, with long-term current use of insulin [facility])   [facility] [place], [place], [place]      Future Appointments            In 1 month [place], [place], PA-C [employer], [place]

## 2020-06-14 ENCOUNTER — Other Ambulatory Visit: Payer: Self-pay | Admitting: Physician Assistant

## 2020-06-14 DIAGNOSIS — E1121 Type 2 diabetes mellitus with diabetic nephropathy: Secondary | ICD-10-CM

## 2020-06-14 DIAGNOSIS — Z794 Long term (current) use of insulin: Secondary | ICD-10-CM

## 2020-06-14 MED ORDER — FREESTYLE LANCETS MISC: 3 refills | Status: DC

## 2020-06-14 MED ORDER — FREESTYLE LITE TEST VI STRP: ORAL_STRIP | 3 refills | Status: DC

## 2020-06-14 NOTE — Progress Notes (Signed)
Reordered freestyle lancets and strips

## 2020-06-19 DIAGNOSIS — E1151 Type 2 diabetes mellitus with diabetic peripheral angiopathy without gangrene: Secondary | ICD-10-CM | POA: Diagnosis not present

## 2020-06-19 DIAGNOSIS — J449 Chronic obstructive pulmonary disease, unspecified: Secondary | ICD-10-CM | POA: Diagnosis not present

## 2020-06-19 DIAGNOSIS — C679 Malignant neoplasm of bladder, unspecified: Secondary | ICD-10-CM | POA: Diagnosis not present

## 2020-06-19 DIAGNOSIS — R0602 Shortness of breath: Secondary | ICD-10-CM | POA: Diagnosis not present

## 2020-06-19 DIAGNOSIS — Z794 Long term (current) use of insulin: Secondary | ICD-10-CM | POA: Diagnosis not present

## 2020-06-19 DIAGNOSIS — R0902 Hypoxemia: Secondary | ICD-10-CM | POA: Diagnosis not present

## 2020-06-19 DIAGNOSIS — N179 Acute kidney failure, unspecified: Secondary | ICD-10-CM | POA: Diagnosis not present

## 2020-06-19 DIAGNOSIS — M6282 Rhabdomyolysis: Secondary | ICD-10-CM | POA: Diagnosis not present

## 2020-06-19 DIAGNOSIS — R0689 Other abnormalities of breathing: Secondary | ICD-10-CM | POA: Diagnosis not present

## 2020-06-19 DIAGNOSIS — J1282 Pneumonia due to coronavirus disease 2019: Secondary | ICD-10-CM | POA: Diagnosis not present

## 2020-06-19 DIAGNOSIS — I129 Hypertensive chronic kidney disease with stage 1 through stage 4 chronic kidney disease, or unspecified chronic kidney disease: Secondary | ICD-10-CM | POA: Diagnosis not present

## 2020-06-19 DIAGNOSIS — U071 COVID-19: Secondary | ICD-10-CM | POA: Diagnosis not present

## 2020-06-19 DIAGNOSIS — Z66 Do not resuscitate: Secondary | ICD-10-CM | POA: Diagnosis not present

## 2020-06-19 DIAGNOSIS — Z20822 Contact with and (suspected) exposure to covid-19: Secondary | ICD-10-CM | POA: Diagnosis not present

## 2020-06-19 DIAGNOSIS — R112 Nausea with vomiting, unspecified: Secondary | ICD-10-CM | POA: Diagnosis not present

## 2020-06-19 DIAGNOSIS — K219 Gastro-esophageal reflux disease without esophagitis: Secondary | ICD-10-CM | POA: Diagnosis not present

## 2020-06-19 DIAGNOSIS — R069 Unspecified abnormalities of breathing: Secondary | ICD-10-CM | POA: Diagnosis not present

## 2020-06-19 DIAGNOSIS — E875 Hyperkalemia: Secondary | ICD-10-CM | POA: Diagnosis not present

## 2020-06-19 DIAGNOSIS — E1122 Type 2 diabetes mellitus with diabetic chronic kidney disease: Secondary | ICD-10-CM | POA: Diagnosis not present

## 2020-06-19 DIAGNOSIS — N183 Chronic kidney disease, stage 3 unspecified: Secondary | ICD-10-CM | POA: Diagnosis not present

## 2020-06-19 DIAGNOSIS — R918 Other nonspecific abnormal finding of lung field: Secondary | ICD-10-CM | POA: Diagnosis not present

## 2020-06-19 DIAGNOSIS — E860 Dehydration: Secondary | ICD-10-CM | POA: Diagnosis not present

## 2020-06-19 DIAGNOSIS — E785 Hyperlipidemia, unspecified: Secondary | ICD-10-CM | POA: Diagnosis not present

## 2020-06-19 DIAGNOSIS — I498 Other specified cardiac arrhythmias: Secondary | ICD-10-CM | POA: Diagnosis not present

## 2020-06-19 DIAGNOSIS — J9601 Acute respiratory failure with hypoxia: Secondary | ICD-10-CM | POA: Diagnosis not present

## 2020-06-19 DIAGNOSIS — I959 Hypotension, unspecified: Secondary | ICD-10-CM | POA: Diagnosis not present

## 2020-06-19 DIAGNOSIS — D649 Anemia, unspecified: Secondary | ICD-10-CM | POA: Diagnosis not present

## 2020-06-20 DIAGNOSIS — R0602 Shortness of breath: Secondary | ICD-10-CM | POA: Diagnosis not present

## 2020-06-20 DIAGNOSIS — N183 Chronic kidney disease, stage 3 unspecified: Secondary | ICD-10-CM | POA: Diagnosis not present

## 2020-06-20 DIAGNOSIS — N179 Acute kidney failure, unspecified: Secondary | ICD-10-CM | POA: Diagnosis not present

## 2020-06-20 DIAGNOSIS — J1282 Pneumonia due to Coronavirus disease 2019: Secondary | ICD-10-CM | POA: Diagnosis not present

## 2020-06-20 DIAGNOSIS — I129 Hypertensive chronic kidney disease with stage 1 through stage 4 chronic kidney disease, or unspecified chronic kidney disease: Secondary | ICD-10-CM | POA: Diagnosis not present

## 2020-06-20 DIAGNOSIS — R918 Other nonspecific abnormal finding of lung field: Secondary | ICD-10-CM | POA: Diagnosis not present

## 2020-06-20 DIAGNOSIS — U071 COVID-19: Secondary | ICD-10-CM | POA: Diagnosis not present

## 2020-06-21 DIAGNOSIS — N179 Acute kidney failure, unspecified: Secondary | ICD-10-CM | POA: Diagnosis not present

## 2020-06-21 DIAGNOSIS — I129 Hypertensive chronic kidney disease with stage 1 through stage 4 chronic kidney disease, or unspecified chronic kidney disease: Secondary | ICD-10-CM | POA: Diagnosis not present

## 2020-06-21 DIAGNOSIS — N183 Chronic kidney disease, stage 3 unspecified: Secondary | ICD-10-CM | POA: Diagnosis not present

## 2020-06-21 DIAGNOSIS — J1282 Pneumonia due to Coronavirus disease 2019: Secondary | ICD-10-CM | POA: Diagnosis not present

## 2020-06-21 DIAGNOSIS — U071 COVID-19: Secondary | ICD-10-CM | POA: Diagnosis not present

## 2020-07-01 DIAGNOSIS — U071 COVID-19: Secondary | ICD-10-CM | POA: Diagnosis not present

## 2020-07-15 ENCOUNTER — Other Ambulatory Visit: Payer: Self-pay

## 2020-07-15 ENCOUNTER — Encounter: Payer: Self-pay | Admitting: Urology

## 2020-07-15 DIAGNOSIS — C61 Malignant neoplasm of prostate: Secondary | ICD-10-CM

## 2020-07-16 ENCOUNTER — Encounter: Payer: Self-pay | Admitting: Urology

## 2020-07-16 ENCOUNTER — Telehealth: Payer: Self-pay | Admitting: Urology

## 2020-07-16 ENCOUNTER — Other Ambulatory Visit: Payer: Self-pay | Admitting: Urology

## 2020-07-16 NOTE — Telephone Encounter (Signed)
Patient was a no show today, please call him to reschedule.    Hollice Espy, MD

## 2020-07-22 ENCOUNTER — Ambulatory Visit: Payer: PPO | Admitting: Physician Assistant

## 2020-07-23 ENCOUNTER — Other Ambulatory Visit: Payer: Self-pay | Admitting: *Deleted

## 2020-07-23 DIAGNOSIS — C61 Malignant neoplasm of prostate: Secondary | ICD-10-CM

## 2020-07-23 MED ORDER — APALUTAMIDE 60 MG PO TABS: 240.0000 mg | ORAL_TABLET | Freq: Every day | ORAL | 11 refills | Status: DC

## 2020-07-24 ENCOUNTER — Other Ambulatory Visit: Payer: Self-pay

## 2020-07-24 DIAGNOSIS — C61 Malignant neoplasm of prostate: Secondary | ICD-10-CM

## 2020-07-30 ENCOUNTER — Other Ambulatory Visit: Payer: Self-pay | Admitting: Physician Assistant

## 2020-07-30 DIAGNOSIS — I10 Essential (primary) hypertension: Secondary | ICD-10-CM

## 2020-07-30 MED ORDER — LISINOPRIL 10 MG PO TABS: 10.0000 mg | ORAL_TABLET | Freq: Every day | ORAL | 1 refills | Status: DC

## 2020-07-30 NOTE — Telephone Encounter (Signed)
[facility] faxed refill request for the following medications:  lisinopril (ZESTRIL) 10 MG tablet - 90 day supply  Please advise. Thanks, [employer]

## 2020-08-01 DIAGNOSIS — U071 COVID-19: Secondary | ICD-10-CM | POA: Diagnosis not present

## 2020-08-02 MED ORDER — APALUTAMIDE 60 MG PO TABS: 240.0000 mg | ORAL_TABLET | Freq: Every day | ORAL | 0 refills | Status: DC

## 2020-08-27 ENCOUNTER — Telehealth: Payer: Self-pay | Admitting: Physician Assistant

## 2020-08-27 DIAGNOSIS — J449 Chronic obstructive pulmonary disease, unspecified: Secondary | ICD-10-CM

## 2020-08-27 MED ORDER — SYMBICORT 160-4.5 MCG/ACT IN AERO: 2.0000 | INHALATION_SPRAY | Freq: Two times a day (BID) | RESPIRATORY_TRACT | 5 refills | Status: DC

## 2020-08-27 NOTE — Telephone Encounter (Signed)
Patient came by the office and would like the following medications refilled  SYMBICORT 160-4.5 MCG/ACT inhaler   He would like it sent to [facility] in [place] on [address].  He is completely out.

## 2020-08-31 DIAGNOSIS — U071 COVID-19: Secondary | ICD-10-CM | POA: Diagnosis not present

## 2020-09-07 ENCOUNTER — Other Ambulatory Visit: Payer: Self-pay | Admitting: Physician Assistant

## 2020-09-07 DIAGNOSIS — I130 Hypertensive heart and chronic kidney disease with heart failure and stage 1 through stage 4 chronic kidney disease, or unspecified chronic kidney disease: Secondary | ICD-10-CM

## 2020-09-07 NOTE — Telephone Encounter (Signed)
Requested Prescriptions  Pending Prescriptions Disp Refills   torsemide (DEMADEX) 10 MG tablet [Pharmacy Med Name: Torsemide 10 MG Oral Tablet] 30 tablet 0    Sig: Take 1 tablet by mouth once daily     Cardiovascular:  Diuretics - Loop Failed - 09/07/2020 10:22 AM      Failed - K in normal range and within 360 days    Potassium  Date Value Ref Range Status  09/28/2018 5.0 3.5 - 5.2 mmol/L Final         Failed - Ca in normal range and within 360 days    Calcium  Date Value Ref Range Status  09/28/2018 9.4 8.6 - 10.2 mg/dL Final         Failed - Na in normal range and within 360 days    Sodium  Date Value Ref Range Status  09/28/2018 144 134 - 144 mmol/L Final         Failed - Cr in normal range and within 360 days    Creatinine, Ser  Date Value Ref Range Status  09/28/2018 2.37 (H) 0.76 - 1.27 mg/dL Final         Failed - Valid encounter within last 6 months    Recent Outpatient Visits          7 months ago Type 2 diabetes mellitus with diabetic nephropathy, with long-term current use of insulin [facility])   [facility] [place], [place], [place]   11 months ago Type 2 diabetes mellitus with diabetic nephropathy, with long-term current use of insulin [facility])   [place], [place], [place]   1 year ago Type 2 diabetes mellitus with diabetic nephropathy, with long-term current use of insulin [facility])   [place], [place], [place]   1 year ago Type 2 diabetes mellitus with diabetic nephropathy, with long-term current use of insulin [facility])   [place], [place], [place]   1 year ago Type 2 diabetes mellitus with diabetic nephropathy, with long-term current use of insulin [facility])   [facility] [place], [place]             Passed - Last BP in normal range    BP Readings from Last 1 Encounters:  04/18/20 131/64

## 2020-09-13 ENCOUNTER — Other Ambulatory Visit: Payer: Self-pay | Admitting: Physician Assistant

## 2020-09-13 DIAGNOSIS — Z794 Long term (current) use of insulin: Secondary | ICD-10-CM

## 2020-09-13 DIAGNOSIS — E1121 Type 2 diabetes mellitus with diabetic nephropathy: Secondary | ICD-10-CM

## 2020-09-13 MED ORDER — ACCU-CHEK AVIVA PLUS VI STRP: ORAL_STRIP | 12 refills | Status: DC

## 2020-09-13 MED ORDER — ACCU-CHEK FASTCLIX LANCETS MISC: 11 refills | Status: DC

## 2020-09-13 MED ORDER — ACCU-CHEK AVIVA PLUS W/DEVICE KIT: PACK | 0 refills | Status: DC

## 2020-09-13 MED ORDER — ACCU-CHEK FASTCLIX LANCET KIT: PACK | 0 refills | Status: DC

## 2020-09-13 NOTE — Progress Notes (Signed)
Accu-chek meter, lancets and strips sent to [facility], [place]

## 2020-09-16 ENCOUNTER — Other Ambulatory Visit: Payer: Self-pay | Admitting: Family Medicine

## 2020-09-16 DIAGNOSIS — E1121 Type 2 diabetes mellitus with diabetic nephropathy: Secondary | ICD-10-CM

## 2020-09-16 MED ORDER — ACCU-CHEK AVIVA PLUS VI STRP: ORAL_STRIP | 12 refills | Status: DC

## 2020-09-27 DIAGNOSIS — Z79899 Other long term (current) drug therapy: Secondary | ICD-10-CM | POA: Diagnosis not present

## 2020-09-27 DIAGNOSIS — N184 Chronic kidney disease, stage 4 (severe): Secondary | ICD-10-CM | POA: Diagnosis not present

## 2020-09-27 DIAGNOSIS — C61 Malignant neoplasm of prostate: Secondary | ICD-10-CM | POA: Diagnosis not present

## 2020-09-27 DIAGNOSIS — J449 Chronic obstructive pulmonary disease, unspecified: Secondary | ICD-10-CM | POA: Diagnosis not present

## 2020-09-27 DIAGNOSIS — I10 Essential (primary) hypertension: Secondary | ICD-10-CM | POA: Diagnosis not present

## 2020-09-27 DIAGNOSIS — Z8551 Personal history of malignant neoplasm of bladder: Secondary | ICD-10-CM | POA: Diagnosis not present

## 2020-09-27 DIAGNOSIS — Z6829 Body mass index (BMI) 29.0-29.9, adult: Secondary | ICD-10-CM | POA: Diagnosis not present

## 2020-09-27 DIAGNOSIS — E119 Type 2 diabetes mellitus without complications: Secondary | ICD-10-CM | POA: Diagnosis not present

## 2020-09-27 DIAGNOSIS — Z9981 Dependence on supplemental oxygen: Secondary | ICD-10-CM | POA: Diagnosis not present

## 2020-10-01 DIAGNOSIS — U071 COVID-19: Secondary | ICD-10-CM | POA: Diagnosis not present

## 2020-10-02 DIAGNOSIS — E611 Iron deficiency: Secondary | ICD-10-CM | POA: Diagnosis not present

## 2020-10-02 DIAGNOSIS — E875 Hyperkalemia: Secondary | ICD-10-CM | POA: Diagnosis not present

## 2020-10-22 DIAGNOSIS — C679 Malignant neoplasm of bladder, unspecified: Secondary | ICD-10-CM | POA: Diagnosis not present

## 2020-10-22 DIAGNOSIS — C61 Malignant neoplasm of prostate: Secondary | ICD-10-CM | POA: Diagnosis not present

## 2020-10-31 DIAGNOSIS — U071 COVID-19: Secondary | ICD-10-CM | POA: Diagnosis not present

## 2020-11-05 DIAGNOSIS — R9721 Rising PSA following treatment for malignant neoplasm of prostate: Secondary | ICD-10-CM | POA: Diagnosis not present

## 2020-11-05 DIAGNOSIS — C61 Malignant neoplasm of prostate: Secondary | ICD-10-CM | POA: Diagnosis not present

## 2020-11-05 DIAGNOSIS — D649 Anemia, unspecified: Secondary | ICD-10-CM | POA: Diagnosis not present

## 2020-11-20 DIAGNOSIS — D649 Anemia, unspecified: Secondary | ICD-10-CM | POA: Diagnosis not present

## 2020-11-20 DIAGNOSIS — R9721 Rising PSA following treatment for malignant neoplasm of prostate: Secondary | ICD-10-CM | POA: Diagnosis not present

## 2020-11-20 DIAGNOSIS — C61 Malignant neoplasm of prostate: Secondary | ICD-10-CM | POA: Diagnosis not present

## 2020-11-22 DIAGNOSIS — I120 Hypertensive chronic kidney disease with stage 5 chronic kidney disease or end stage renal disease: Secondary | ICD-10-CM | POA: Diagnosis not present

## 2020-11-22 DIAGNOSIS — K219 Gastro-esophageal reflux disease without esophagitis: Secondary | ICD-10-CM | POA: Diagnosis not present

## 2020-11-22 DIAGNOSIS — Z8551 Personal history of malignant neoplasm of bladder: Secondary | ICD-10-CM | POA: Diagnosis not present

## 2020-11-22 DIAGNOSIS — D631 Anemia in chronic kidney disease: Secondary | ICD-10-CM | POA: Diagnosis not present

## 2020-11-22 DIAGNOSIS — N186 End stage renal disease: Secondary | ICD-10-CM | POA: Diagnosis not present

## 2020-11-22 DIAGNOSIS — M1990 Unspecified osteoarthritis, unspecified site: Secondary | ICD-10-CM | POA: Diagnosis not present

## 2020-11-22 DIAGNOSIS — J449 Chronic obstructive pulmonary disease, unspecified: Secondary | ICD-10-CM | POA: Diagnosis not present

## 2020-11-22 DIAGNOSIS — E1122 Type 2 diabetes mellitus with diabetic chronic kidney disease: Secondary | ICD-10-CM | POA: Diagnosis not present

## 2020-11-22 DIAGNOSIS — Z8616 Personal history of COVID-19: Secondary | ICD-10-CM | POA: Diagnosis not present

## 2020-11-22 DIAGNOSIS — R0602 Shortness of breath: Secondary | ICD-10-CM | POA: Diagnosis not present

## 2020-11-27 DIAGNOSIS — D6481 Anemia due to antineoplastic chemotherapy: Secondary | ICD-10-CM | POA: Diagnosis not present

## 2020-11-27 DIAGNOSIS — C678 Malignant neoplasm of overlapping sites of bladder: Secondary | ICD-10-CM | POA: Diagnosis not present

## 2020-11-27 DIAGNOSIS — T451X5A Adverse effect of antineoplastic and immunosuppressive drugs, initial encounter: Secondary | ICD-10-CM | POA: Diagnosis not present

## 2020-12-01 DIAGNOSIS — U071 COVID-19: Secondary | ICD-10-CM | POA: Diagnosis not present

## 2020-12-04 DIAGNOSIS — T451X5A Adverse effect of antineoplastic and immunosuppressive drugs, initial encounter: Secondary | ICD-10-CM | POA: Diagnosis not present

## 2020-12-04 DIAGNOSIS — C61 Malignant neoplasm of prostate: Secondary | ICD-10-CM | POA: Diagnosis not present

## 2020-12-04 DIAGNOSIS — D6481 Anemia due to antineoplastic chemotherapy: Secondary | ICD-10-CM | POA: Diagnosis not present

## 2020-12-04 DIAGNOSIS — C678 Malignant neoplasm of overlapping sites of bladder: Secondary | ICD-10-CM | POA: Diagnosis not present

## 2020-12-04 DIAGNOSIS — D649 Anemia, unspecified: Secondary | ICD-10-CM | POA: Diagnosis not present

## 2020-12-04 DIAGNOSIS — R9721 Rising PSA following treatment for malignant neoplasm of prostate: Secondary | ICD-10-CM | POA: Diagnosis not present

## 2020-12-11 DIAGNOSIS — C61 Malignant neoplasm of prostate: Secondary | ICD-10-CM | POA: Diagnosis not present

## 2020-12-11 DIAGNOSIS — D6481 Anemia due to antineoplastic chemotherapy: Secondary | ICD-10-CM | POA: Diagnosis not present

## 2020-12-11 DIAGNOSIS — T451X5A Adverse effect of antineoplastic and immunosuppressive drugs, initial encounter: Secondary | ICD-10-CM | POA: Diagnosis not present

## 2020-12-11 DIAGNOSIS — R9721 Rising PSA following treatment for malignant neoplasm of prostate: Secondary | ICD-10-CM | POA: Diagnosis not present

## 2020-12-11 DIAGNOSIS — C678 Malignant neoplasm of overlapping sites of bladder: Secondary | ICD-10-CM | POA: Diagnosis not present

## 2020-12-18 DIAGNOSIS — T451X5A Adverse effect of antineoplastic and immunosuppressive drugs, initial encounter: Secondary | ICD-10-CM | POA: Diagnosis not present

## 2020-12-18 DIAGNOSIS — D6481 Anemia due to antineoplastic chemotherapy: Secondary | ICD-10-CM | POA: Diagnosis not present

## 2020-12-18 DIAGNOSIS — C61 Malignant neoplasm of prostate: Secondary | ICD-10-CM | POA: Diagnosis not present

## 2020-12-18 DIAGNOSIS — R9721 Rising PSA following treatment for malignant neoplasm of prostate: Secondary | ICD-10-CM | POA: Diagnosis not present

## 2020-12-25 DIAGNOSIS — R9721 Rising PSA following treatment for malignant neoplasm of prostate: Secondary | ICD-10-CM | POA: Diagnosis not present

## 2020-12-25 DIAGNOSIS — D509 Iron deficiency anemia, unspecified: Secondary | ICD-10-CM | POA: Diagnosis not present

## 2020-12-25 DIAGNOSIS — D6481 Anemia due to antineoplastic chemotherapy: Secondary | ICD-10-CM | POA: Diagnosis not present

## 2020-12-25 DIAGNOSIS — D649 Anemia, unspecified: Secondary | ICD-10-CM | POA: Diagnosis not present

## 2020-12-25 DIAGNOSIS — T451X5A Adverse effect of antineoplastic and immunosuppressive drugs, initial encounter: Secondary | ICD-10-CM | POA: Diagnosis not present

## 2020-12-25 DIAGNOSIS — C61 Malignant neoplasm of prostate: Secondary | ICD-10-CM | POA: Diagnosis not present

## 2020-12-26 DIAGNOSIS — D509 Iron deficiency anemia, unspecified: Secondary | ICD-10-CM | POA: Diagnosis not present

## 2020-12-26 DIAGNOSIS — T451X5A Adverse effect of antineoplastic and immunosuppressive drugs, initial encounter: Secondary | ICD-10-CM | POA: Diagnosis not present

## 2020-12-26 DIAGNOSIS — D6481 Anemia due to antineoplastic chemotherapy: Secondary | ICD-10-CM | POA: Diagnosis not present

## 2020-12-26 DIAGNOSIS — C61 Malignant neoplasm of prostate: Secondary | ICD-10-CM | POA: Diagnosis not present

## 2020-12-31 DIAGNOSIS — D6481 Anemia due to antineoplastic chemotherapy: Secondary | ICD-10-CM | POA: Diagnosis not present

## 2020-12-31 DIAGNOSIS — D509 Iron deficiency anemia, unspecified: Secondary | ICD-10-CM | POA: Diagnosis not present

## 2020-12-31 DIAGNOSIS — C61 Malignant neoplasm of prostate: Secondary | ICD-10-CM | POA: Diagnosis not present

## 2020-12-31 DIAGNOSIS — T451X5A Adverse effect of antineoplastic and immunosuppressive drugs, initial encounter: Secondary | ICD-10-CM | POA: Diagnosis not present

## 2021-01-01 DIAGNOSIS — U071 COVID-19: Secondary | ICD-10-CM | POA: Diagnosis not present

## 2021-01-02 DIAGNOSIS — C61 Malignant neoplasm of prostate: Secondary | ICD-10-CM | POA: Diagnosis not present

## 2021-01-02 DIAGNOSIS — T451X5A Adverse effect of antineoplastic and immunosuppressive drugs, initial encounter: Secondary | ICD-10-CM | POA: Diagnosis not present

## 2021-01-02 DIAGNOSIS — D509 Iron deficiency anemia, unspecified: Secondary | ICD-10-CM | POA: Diagnosis not present

## 2021-01-02 DIAGNOSIS — D6481 Anemia due to antineoplastic chemotherapy: Secondary | ICD-10-CM | POA: Diagnosis not present

## 2021-01-06 DIAGNOSIS — C61 Malignant neoplasm of prostate: Secondary | ICD-10-CM | POA: Diagnosis not present

## 2021-01-06 DIAGNOSIS — R9721 Rising PSA following treatment for malignant neoplasm of prostate: Secondary | ICD-10-CM | POA: Diagnosis not present

## 2021-01-06 DIAGNOSIS — D509 Iron deficiency anemia, unspecified: Secondary | ICD-10-CM | POA: Diagnosis not present

## 2021-01-06 DIAGNOSIS — T451X5A Adverse effect of antineoplastic and immunosuppressive drugs, initial encounter: Secondary | ICD-10-CM | POA: Diagnosis not present

## 2021-01-06 DIAGNOSIS — D6481 Anemia due to antineoplastic chemotherapy: Secondary | ICD-10-CM | POA: Diagnosis not present

## 2021-01-09 DIAGNOSIS — T451X5A Adverse effect of antineoplastic and immunosuppressive drugs, initial encounter: Secondary | ICD-10-CM | POA: Diagnosis not present

## 2021-01-09 DIAGNOSIS — D6481 Anemia due to antineoplastic chemotherapy: Secondary | ICD-10-CM | POA: Diagnosis not present

## 2021-01-09 DIAGNOSIS — C61 Malignant neoplasm of prostate: Secondary | ICD-10-CM | POA: Diagnosis not present

## 2021-01-09 DIAGNOSIS — D509 Iron deficiency anemia, unspecified: Secondary | ICD-10-CM | POA: Diagnosis not present

## 2021-01-16 DIAGNOSIS — N2889 Other specified disorders of kidney and ureter: Secondary | ICD-10-CM | POA: Diagnosis not present

## 2021-01-16 DIAGNOSIS — C679 Malignant neoplasm of bladder, unspecified: Secondary | ICD-10-CM | POA: Diagnosis not present

## 2021-01-20 DIAGNOSIS — C61 Malignant neoplasm of prostate: Secondary | ICD-10-CM | POA: Diagnosis not present

## 2021-01-20 DIAGNOSIS — C679 Malignant neoplasm of bladder, unspecified: Secondary | ICD-10-CM | POA: Diagnosis not present

## 2021-01-29 DIAGNOSIS — U071 COVID-19: Secondary | ICD-10-CM | POA: Diagnosis not present

## 2021-02-01 ENCOUNTER — Other Ambulatory Visit: Payer: Self-pay | Admitting: Physician Assistant

## 2021-02-01 DIAGNOSIS — Z794 Long term (current) use of insulin: Secondary | ICD-10-CM

## 2021-02-04 DIAGNOSIS — D6481 Anemia due to antineoplastic chemotherapy: Secondary | ICD-10-CM | POA: Diagnosis not present

## 2021-02-04 DIAGNOSIS — T451X5A Adverse effect of antineoplastic and immunosuppressive drugs, initial encounter: Secondary | ICD-10-CM | POA: Diagnosis not present

## 2021-02-04 DIAGNOSIS — D509 Iron deficiency anemia, unspecified: Secondary | ICD-10-CM | POA: Diagnosis not present

## 2021-02-06 DIAGNOSIS — D6481 Anemia due to antineoplastic chemotherapy: Secondary | ICD-10-CM | POA: Diagnosis not present

## 2021-02-06 DIAGNOSIS — R9721 Rising PSA following treatment for malignant neoplasm of prostate: Secondary | ICD-10-CM | POA: Diagnosis not present

## 2021-02-06 DIAGNOSIS — T451X5A Adverse effect of antineoplastic and immunosuppressive drugs, initial encounter: Secondary | ICD-10-CM | POA: Diagnosis not present

## 2021-02-06 DIAGNOSIS — C678 Malignant neoplasm of overlapping sites of bladder: Secondary | ICD-10-CM | POA: Diagnosis not present

## 2021-02-06 DIAGNOSIS — C61 Malignant neoplasm of prostate: Secondary | ICD-10-CM | POA: Diagnosis not present

## 2021-02-14 DIAGNOSIS — R9721 Rising PSA following treatment for malignant neoplasm of prostate: Secondary | ICD-10-CM | POA: Diagnosis not present

## 2021-02-14 DIAGNOSIS — T451X5A Adverse effect of antineoplastic and immunosuppressive drugs, initial encounter: Secondary | ICD-10-CM | POA: Diagnosis not present

## 2021-02-14 DIAGNOSIS — C61 Malignant neoplasm of prostate: Secondary | ICD-10-CM | POA: Diagnosis not present

## 2021-02-14 DIAGNOSIS — C678 Malignant neoplasm of overlapping sites of bladder: Secondary | ICD-10-CM | POA: Diagnosis not present

## 2021-02-14 DIAGNOSIS — D6481 Anemia due to antineoplastic chemotherapy: Secondary | ICD-10-CM | POA: Diagnosis not present

## 2021-10-31 ENCOUNTER — Other Ambulatory Visit: Payer: Self-pay | Admitting: Physician Assistant

## 2021-10-31 DIAGNOSIS — E1121 Type 2 diabetes mellitus with diabetic nephropathy: Secondary | ICD-10-CM

## 2021-10-31 DIAGNOSIS — Z794 Long term (current) use of insulin: Secondary | ICD-10-CM

## 2022-12-24 DEATH — deceased
